# Patient Record
Sex: Female | Born: 1990 | Race: Black or African American | Hispanic: No | Marital: Married | Smoking: Never smoker
Health system: Southern US, Community
[De-identification: ages and names within clinical notes are randomized; demographics above are authoritative.]

## PROBLEM LIST (undated history)

## (undated) ENCOUNTER — Inpatient Hospital Stay (HOSPITAL_COMMUNITY): Payer: Self-pay

## (undated) DIAGNOSIS — A549 Gonococcal infection, unspecified: Secondary | ICD-10-CM

## (undated) DIAGNOSIS — N83209 Unspecified ovarian cyst, unspecified side: Secondary | ICD-10-CM

## (undated) DIAGNOSIS — D649 Anemia, unspecified: Secondary | ICD-10-CM

## (undated) DIAGNOSIS — A749 Chlamydial infection, unspecified: Secondary | ICD-10-CM

## (undated) DIAGNOSIS — B999 Unspecified infectious disease: Secondary | ICD-10-CM

## (undated) HISTORY — PX: WISDOM TOOTH EXTRACTION: SHX21

## (undated) HISTORY — PX: ANTERIOR CRUCIATE LIGAMENT REPAIR: SHX115

---
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
## 2012-03-23 ENCOUNTER — Encounter (HOSPITAL_COMMUNITY): Payer: Self-pay | Admitting: *Deleted

## 2012-03-23 ENCOUNTER — Emergency Department (HOSPITAL_COMMUNITY)
Admission: EM | Admit: 2012-03-23 | Discharge: 2012-03-23 | Disposition: A | Discharge status: Home / Self Care | Payer: Medicaid Other | Attending: Emergency Medicine | Admitting: Emergency Medicine

## 2012-03-23 DIAGNOSIS — E669 Obesity, unspecified: Secondary | ICD-10-CM | POA: Insufficient documentation

## 2012-03-23 DIAGNOSIS — J069 Acute upper respiratory infection, unspecified: Secondary | ICD-10-CM | POA: Insufficient documentation

## 2012-03-23 DIAGNOSIS — N390 Urinary tract infection, site not specified: Secondary | ICD-10-CM | POA: Insufficient documentation

## 2012-03-23 LAB — COMPREHENSIVE METABOLIC PANEL
ALT: 10 U/L (ref 0–35)
Alkaline Phosphatase: 78 U/L (ref 39–117)
CO2: 25 mEq/L (ref 19–32)
Chloride: 100 mEq/L (ref 96–112)
GFR calc Af Amer: >90 mL/min (ref >90)
GFR calc non Af Amer: >90 mL/min (ref >90)
Glucose, Bld: 95 mg/dL (ref 70–99)
Potassium: 4 mEq/L (ref 3.5–5.1)
Sodium: 135 mEq/L (ref 135–145)

## 2012-03-23 LAB — URINALYSIS, ROUTINE W REFLEX MICROSCOPIC
Bilirubin Urine: NEGATIVE
Hgb urine dipstick: NEGATIVE
Ketones, ur: NEGATIVE mg/dL
Nitrite: NEGATIVE
Urobilinogen, UA: 1 mg/dL (ref 0.0–1.0)

## 2012-03-23 LAB — CBC WITH DIFFERENTIAL/PLATELET
Lymphocytes Relative: 26 % (ref 12–46)
Lymphs Abs: 2.1 10*3/uL (ref 0.7–4.0)
MCV: 88.6 fL (ref 78.0–100.0)
Neutro Abs: 5.2 10*3/uL (ref 1.7–7.7)
Neutrophils Relative %: 67 % (ref 43–77)
Platelets: 267 10*3/uL (ref 150–400)
RBC: 3.85 MIL/uL — ABNORMAL LOW (ref 3.87–5.11)
WBC: 7.8 10*3/uL (ref 4.0–10.5)

## 2012-03-23 LAB — URINE MICROSCOPIC-ADD ON

## 2012-03-23 MED ORDER — ACETAMINOPHEN 325 MG PO TABS
650.0000 mg | ORAL_TABLET | Freq: Once | ORAL | Status: AC
  Administered 2012-03-23: 650 mg via ORAL
  Filled 2012-03-23: qty 2

## 2012-03-23 MED ORDER — CEPHALEXIN 500 MG PO CAPS: 500.0000 mg | ORAL_CAPSULE | Freq: Four times a day (QID) | ORAL | Status: DC

## 2012-03-23 NOTE — ED Notes (Signed)
Pt arrived by EMS. C/o abd/ back pain with n/v fever and body aches x's 2 weeks.

## 2012-03-23 NOTE — ED Provider Notes (Signed)
History     CSN: [id number]  Arrival date & time 03/23/12  1906   First MD Initiated Contact with Patient 03/23/12 2220      Chief Complaint  Patient presents with  . Fever  . Abdominal Pain   HPI  History provided by the patient. Patient is 21 year old female with no significant PMH who presents with complaints of fever, body aches, nasal congestion, rhinorrhea and slight cough for the past one to 2 weeks. Symptoms began with nasal congestion and sinus pressure. Patient reports prior history of seasonal sinus problems. Symptoms progressed into fever, body aches and occasional cough symptoms. Over the past few days patient also reports some lower abdominal discomfort radiating to bilateral sides and back. She denies having any dysuria, hematuria, urinary frequency. She denies any vaginal bleeding or vaginal discharge or irregular menstrual cycle. She denies any nausea, vomiting, diarrhea or constipation symptoms. Patient has no recent travel. She has been around some friends with recent illness of cough and congestion symptoms.    History reviewed. No pertinent past medical history.  Past Surgical History  Procedure Date  . Wisdom tooth extraction   . Anterior cruciate ligament repair Left knee    History reviewed. No pertinent family history.  History  Substance Use Topics  . Smoking status: Not on file  . Smokeless tobacco: Not on file  . Alcohol Use: Yes    OB History    Grav Para Term Preterm Abortions TAB SAB Ect Mult Living                  Review of Systems  Constitutional: Positive for fever and fatigue. Negative for chills, diaphoresis and appetite change.  HENT: Positive for congestion, rhinorrhea and sinus pressure. Negative for sore throat.   Respiratory: Positive for cough.   Gastrointestinal: Positive for abdominal pain. Negative for nausea, vomiting, diarrhea and constipation.  Genitourinary: Negative for dysuria, frequency, hematuria, flank pain, vaginal  bleeding, vaginal discharge, menstrual problem and pelvic pain.  Musculoskeletal: Positive for back pain.    Allergies  Review of patient's allergies indicates no known allergies.  Home Medications  No current outpatient prescriptions on file.  BP 137/78  Pulse 97  Temp 99.1 F (37.3 C) (Oral)  Resp 20  Ht 5\' 4"  (1.626 m)  Wt 180 lb (81.647 kg)  BMI 30.90 kg/m2  SpO2 100%  LMP 03/13/2012  Physical Exam  Nursing note and vitals reviewed. Constitutional: She is oriented to person, place, and time. She appears well-developed and well-nourished. No distress.  HENT:  Head: Normocephalic.  Right Ear: Tympanic membrane normal.  Nose: Nose normal.  Mouth/Throat: Oropharynx is clear and moist.  Eyes: Conjunctivae normal and EOM are normal. Pupils are equal, round, and reactive to light.  Neck: Normal range of motion. Neck supple.       No meningeal signs  Cardiovascular: Normal rate and regular rhythm.   Pulmonary/Chest: Effort normal and breath sounds normal. No respiratory distress. She has no rales.  Abdominal: Soft. She exhibits no distension and no mass. There is tenderness. There is no rebound, no guarding, no CVA tenderness, no tenderness at McBurney's point and negative Murphy's sign.       Obese. Very mild diffuse lower tenderness.  Neurological: She is alert and oriented to person, place, and time.  Skin: Skin is warm and dry. No rash noted. No erythema.  Psychiatric: She has a normal mood and affect. Her behavior is normal.    ED Course  Procedures  Results for orders placed during the hospital encounter of 03/23/12  CBC WITH DIFFERENTIAL      Component Value Range   WBC 7.8  4.0 - 10.5 K/uL   RBC 3.85 (*) 3.87 - 5.11 MIL/uL   Hemoglobin 11.4 (*) 12.0 - 15.0 g/dL   HCT 16.1 (*) 09.6 - 04.5 %   MCV 88.6  78.0 - 100.0 fL   MCH 29.6  26.0 - 34.0 pg   MCHC 33.4  30.0 - 36.0 g/dL   RDW 40.9  81.1 - 91.4 %   Platelets 267  150 - 400 K/uL   Neutrophils Relative 67   43 - 77 %   Neutro Abs 5.2  1.7 - 7.7 K/uL   Lymphocytes Relative 26  12 - 46 %   Lymphs Abs 2.1  0.7 - 4.0 K/uL   Monocytes Relative 7  3 - 12 %   Monocytes Absolute 0.5  0.1 - 1.0 K/uL   Eosinophils Relative 0  0 - 5 %   Eosinophils Absolute 0.0  0.0 - 0.7 K/uL   Basophils Relative 0  0 - 1 %   Basophils Absolute 0.0  0.0 - 0.1 K/uL  COMPREHENSIVE METABOLIC PANEL      Component Value Range   Sodium 135  135 - 145 mEq/L   Potassium 4.0  3.5 - 5.1 mEq/L   Chloride 100  96 - 112 mEq/L   CO2 25  19 - 32 mEq/L   Glucose, Bld 95  70 - 99 mg/dL   BUN 8  6 - 23 mg/dL   Creatinine, Ser 7.82  0.50 - 1.10 mg/dL   Calcium 9.6  8.4 - 95.6 mg/dL   Total Protein 7.5  6.0 - 8.3 g/dL   Albumin 3.3 (*) 3.5 - 5.2 g/dL   AST 16  0 - 37 U/L   ALT 10  0 - 35 U/L   Alkaline Phosphatase 78  39 - 117 U/L   Total Bilirubin 0.3  0.3 - 1.2 mg/dL   GFR calc non Af Amer >90  >90 mL/min   GFR calc Af Amer >90  >90 mL/min  URINALYSIS, ROUTINE W REFLEX MICROSCOPIC      Component Value Range   Color, Urine YELLOW  YELLOW   APPearance CLOUDY (*) CLEAR   Specific Gravity, Urine 1.017  1.005 - 1.030   pH 7.5  5.0 - 8.0   Glucose, UA NEGATIVE  NEGATIVE mg/dL   Hgb urine dipstick NEGATIVE  NEGATIVE   Bilirubin Urine NEGATIVE  NEGATIVE   Ketones, ur NEGATIVE  NEGATIVE mg/dL   Protein, ur 30 (*) NEGATIVE mg/dL   Urobilinogen, UA 1.0  0.0 - 1.0 mg/dL   Nitrite NEGATIVE  NEGATIVE   Leukocytes, UA LARGE (*) NEGATIVE  PREGNANCY, URINE      Component Value Range   Preg Test, Ur NEGATIVE  NEGATIVE  URINE MICROSCOPIC-ADD ON      Component Value Range   Squamous Epithelial / LPF MANY (*) RARE   WBC, UA 11-20  <3 WBC/hpf   Bacteria, UA MANY (*) RARE       1. UTI (lower urinary tract infection)   2. URI (upper respiratory infection)       MDM  10:30PM patient seen and evaluated. Patient in no acute distress. Patient is well-appearing and nontoxic.   UA shows signs concerning for possible UTI. Patient  has unremarkable exam otherwise. She appears well. Patient's primary symptoms consistent with viral URI. Will plan to  treat UTI with Keflex.        Angus Seller, [place] 03/24/12 [phone]

## 2012-03-23 NOTE — ED Notes (Signed)
Pt reports fever, body aches, nonproductive cough x 1 week. States that she gets a sinus infection every fall, but that she thinks she has the flu now. Pt has only taken OTC sinus medicine. Recheck of temperature is 99.1

## 2012-03-24 NOTE — ED Provider Notes (Signed)
Medical screening examination/treatment/procedure(s) were performed by non-physician practitioner and as supervising physician I was immediately available for consultation/collaboration.  Elinor Kleine, MD 03/24/12 1535 

## 2012-05-02 ENCOUNTER — Emergency Department (HOSPITAL_COMMUNITY)
Admission: EM | Admit: 2012-05-02 | Discharge: 2012-05-02 | Disposition: A | Discharge status: Home / Self Care | Payer: PRIVATE HEALTH INSURANCE | Attending: Emergency Medicine | Admitting: Emergency Medicine

## 2012-05-02 ENCOUNTER — Encounter (HOSPITAL_COMMUNITY): Payer: Self-pay | Admitting: Emergency Medicine

## 2012-05-02 DIAGNOSIS — B9689 Other specified bacterial agents as the cause of diseases classified elsewhere: Secondary | ICD-10-CM | POA: Insufficient documentation

## 2012-05-02 DIAGNOSIS — N760 Acute vaginitis: Secondary | ICD-10-CM | POA: Insufficient documentation

## 2012-05-02 LAB — URINALYSIS, ROUTINE W REFLEX MICROSCOPIC
Glucose, UA: NEGATIVE mg/dL
Hgb urine dipstick: NEGATIVE
Specific Gravity, Urine: 1.027 (ref 1.005–1.030)
Urobilinogen, UA: 0.2 mg/dL (ref 0.0–1.0)

## 2012-05-02 LAB — URINE MICROSCOPIC-ADD ON

## 2012-05-02 LAB — PREGNANCY, URINE: Preg Test, Ur: NEGATIVE

## 2012-05-02 LAB — WET PREP, GENITAL

## 2012-05-02 MED ORDER — METRONIDAZOLE 500 MG PO TABS: 2000.0000 mg | ORAL_TABLET | Freq: Once | ORAL | Status: DC

## 2012-05-02 NOTE — ED Provider Notes (Signed)
History     CSN: [id number]  Arrival date & time 05/02/12  1650   First MD Initiated Contact with Patient 05/02/12 1756      Chief Complaint  Patient presents with  . Abdominal Pain     HPI Patient presents emergent complaints of abdominal pain ongoing since October 10. She was seen in the emergency room at that time and was diagnosed with a urinary tract infection. She took antibiotics but does not feel like the symptoms have improved that much she does not have any dysuria or hematuria. She has not had any fevers vomiting or diarrhea. She states she has a discomfort in her suprapubic region somewhat intermittent..  she denies any vaginal discharge or any dyspareunia.  Patient does not have a doctor in this area so she came to the emergency department for treatment History reviewed. No pertinent past medical history.  Past Surgical History  Procedure Date  . Wisdom tooth extraction   . Anterior cruciate ligament repair Left knee    History reviewed. No pertinent family history.  History  Substance Use Topics  . Smoking status: Never Smoker   . Smokeless tobacco: Not on file  . Alcohol Use: Yes    OB History    Grav Para Term Preterm Abortions TAB SAB Ect Mult Living                  Review of Systems  All other systems reviewed and are negative.    Allergies  Review of patient's allergies indicates no known allergies.  Home Medications   Current Outpatient Rx  Name  Route  Sig  Dispense  Refill  . IBUPROFEN 200 MG PO TABS   Oral   Take 600 mg by mouth every 6 (six) hours as needed. Pain         . METRONIDAZOLE 500 MG PO TABS   Oral   Take 4 tablets (2,000 mg total) by mouth once.   4 tablet   0     BP 126/72  Pulse 87  Temp 98.9 F (37.2 C) (Oral)  Resp 15  SpO2 100%  LMP 04/25/2012  Physical Exam  Nursing note and vitals reviewed. Constitutional: She appears well-developed and well-nourished. No distress.  HENT:  Head: Normocephalic and  atraumatic.  Right Ear: External ear normal.  Left Ear: External ear normal.  Eyes: Conjunctivae normal are normal. Right eye exhibits no discharge. Left eye exhibits no discharge. No scleral icterus.  Neck: Neck supple. No tracheal deviation present.  Cardiovascular: Normal rate, regular rhythm and intact distal pulses.   Pulmonary/Chest: Effort normal and breath sounds normal. No stridor. No respiratory distress. She has no wheezes. She has no rales.  Abdominal: Soft. Bowel sounds are normal. She exhibits no distension. There is no tenderness. There is no rebound and no guarding.  Genitourinary: There is no rash on the right labia. There is no rash on the left labia. Uterus is not deviated, not enlarged and not tender. Cervix exhibits discharge. Cervix exhibits no motion tenderness and no friability. Right adnexum displays no mass and no tenderness. Left adnexum displays no mass and no tenderness. No erythema, tenderness or bleeding around the vagina. No foreign body around the vagina. No signs of injury around the vagina. Vaginal discharge found.  Musculoskeletal: She exhibits no edema and no tenderness.  Neurological: She is alert. She has normal strength. No sensory deficit. Cranial nerve deficit:  no gross defecits noted. She exhibits normal muscle tone. She displays  no seizure activity. Coordination normal.  Skin: Skin is warm and dry. No rash noted.  Psychiatric: She has a normal mood and affect.    ED Course  Procedures (including critical care time)  Labs Reviewed  URINALYSIS, ROUTINE W REFLEX MICROSCOPIC - Abnormal; Notable for the following:    APPearance CLOUDY (*)     Leukocytes, UA MODERATE (*)     All other components within normal limits  URINE MICROSCOPIC-ADD ON - Abnormal; Notable for the following:    Squamous Epithelial / LPF FEW (*)     Bacteria, UA FEW (*)     All other components within normal limits  WET PREP, GENITAL - Abnormal; Notable for the following:    Clue  Cells Wet Prep HPF POC MANY (*)     WBC, Wet Prep HPF POC MANY (*)     All other components within normal limits  PREGNANCY, URINE  URINE CULTURE  GC/CHLAMYDIA PROBE AMP  RPR   No results found.   1. Bacterial vaginosis       MDM  Will treat with flagyl.  Doubt uti.  Doubt PID       Celene Kras, MD 05/03/12 [phone]

## 2012-05-02 NOTE — ED Notes (Addendum)
Patient diagnosed with UTI on Oct 10th.  Patient took her medication- 7 day treatment with Keflex but pain returned one week ago.  Pt denies vaginal discharge or dysuria.

## 2012-05-02 NOTE — ED Notes (Signed)
Pt complains of pain in lower abdominal area and dysuria

## 2012-05-02 NOTE — ED Notes (Signed)
Patient reports that she was here on the 10th of October for similar symptoms and the medications that she was given did not work. She reports that she is still having lower abdominal pain.

## 2012-05-02 NOTE — Progress Notes (Signed)
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  Pt voiced concern about moving from triage to acute room #1 Pt states she has returned to [facility] ED because of need to change uti medication.  Pt confirms urine sample provided Cm explained to pt that EDP will review UA, visit her with results and decide if another test is needed Pt voiced understanding Discussed and proved pt advocate contact information if needed

## 2012-05-03 LAB — URINE CULTURE

## 2012-05-03 LAB — RPR: RPR Ser Ql: NONREACTIVE

## 2012-05-03 LAB — GC/CHLAMYDIA PROBE AMP, GENITAL: Chlamydia, DNA Probe: POSITIVE — AB

## 2012-05-11 NOTE — ED Notes (Signed)
Chart returned from EDP office with orders written by Rhea Bleacher for Azithromycin  1000 mg po once and cefixime 400 mg po once.

## 2012-05-13 ENCOUNTER — Telehealth (HOSPITAL_COMMUNITY): Payer: Self-pay | Admitting: Emergency Medicine

## 2012-05-13 NOTE — ED Notes (Signed)
Rxs called in to [facility] in [place], [place] [phone]) by Jaci Lazier PFM. Per pharmacy, have to order cefixime--will have for the patient on Mon.

## 2014-01-16 ENCOUNTER — Encounter (HOSPITAL_COMMUNITY): Payer: Self-pay | Admitting: *Deleted

## 2014-01-16 ENCOUNTER — Inpatient Hospital Stay (HOSPITAL_COMMUNITY): Payer: Medicaid Other

## 2014-01-16 ENCOUNTER — Inpatient Hospital Stay (HOSPITAL_COMMUNITY)
Admission: AD | Admit: 2014-01-16 | Discharge: 2014-01-16 | Disposition: A | Discharge status: Home / Self Care | Payer: Medicaid Other | Source: Ambulatory Visit | Attending: Obstetrics & Gynecology | Admitting: Obstetrics & Gynecology

## 2014-01-16 DIAGNOSIS — R109 Unspecified abdominal pain: Secondary | ICD-10-CM | POA: Diagnosis present

## 2014-01-16 DIAGNOSIS — N760 Acute vaginitis: Secondary | ICD-10-CM | POA: Diagnosis not present

## 2014-01-16 DIAGNOSIS — O2390 Unspecified genitourinary tract infection in pregnancy, unspecified trimester: Secondary | ICD-10-CM | POA: Diagnosis not present

## 2014-01-16 DIAGNOSIS — A499 Bacterial infection, unspecified: Secondary | ICD-10-CM | POA: Diagnosis not present

## 2014-01-16 DIAGNOSIS — B9689 Other specified bacterial agents as the cause of diseases classified elsewhere: Secondary | ICD-10-CM | POA: Insufficient documentation

## 2014-01-16 DIAGNOSIS — O9989 Other specified diseases and conditions complicating pregnancy, childbirth and the puerperium: Secondary | ICD-10-CM

## 2014-01-16 DIAGNOSIS — O26899 Other specified pregnancy related conditions, unspecified trimester: Secondary | ICD-10-CM

## 2014-01-16 LAB — CBC
HEMATOCRIT: 39.8 % (ref 36.0–46.0)
Hemoglobin: 13.4 g/dL (ref 12.0–15.0)
MCH: 30.5 pg (ref 26.0–34.0)
MCHC: 33.7 g/dL (ref 30.0–36.0)
MCV: 90.7 fL (ref 78.0–100.0)
Platelets: 241 10*3/uL (ref 150–400)
RBC: 4.39 MIL/uL (ref 3.87–5.11)
RDW: 13.4 % (ref 11.5–15.5)
WBC: 9.8 10*3/uL (ref 4.0–10.5)

## 2014-01-16 LAB — WET PREP, GENITAL
Trich, Wet Prep: NONE SEEN
YEAST WET PREP: NONE SEEN

## 2014-01-16 LAB — URINALYSIS, ROUTINE W REFLEX MICROSCOPIC
BILIRUBIN URINE: NEGATIVE
Glucose, UA: NEGATIVE mg/dL
HGB URINE DIPSTICK: NEGATIVE
KETONES UR: NEGATIVE mg/dL
Leukocytes, UA: NEGATIVE
Nitrite: NEGATIVE
PROTEIN: NEGATIVE mg/dL
Specific Gravity, Urine: 1.015 (ref 1.005–1.030)
UROBILINOGEN UA: 0.2 mg/dL (ref 0.0–1.0)
pH: 6 (ref 5.0–8.0)

## 2014-01-16 LAB — ABO/RH: ABO/RH(D): A POS

## 2014-01-16 LAB — OB RESULTS CONSOLE GC/CHLAMYDIA
CHLAMYDIA, DNA PROBE: NEGATIVE
Gonorrhea: NEGATIVE

## 2014-01-16 LAB — HCG, QUANTITATIVE, PREGNANCY: hCG, Beta Chain, Quant, S: 204 m[IU]/mL — ABNORMAL HIGH (ref <5)

## 2014-01-16 LAB — POCT PREGNANCY, URINE: Preg Test, Ur: POSITIVE — AB

## 2014-01-16 MED ORDER — METRONIDAZOLE 500 MG PO TABS: 500.0000 mg | ORAL_TABLET | Freq: Two times a day (BID) | ORAL | Status: DC

## 2014-01-16 NOTE — MAU Note (Signed)
Pos UPT @ [facility], approx 5 weeks, extreme cramping for the last week.  Denies bleeding or discharge.

## 2014-01-16 NOTE — Discharge Instructions (Signed)

## 2014-01-16 NOTE — MAU Provider Note (Signed)
History     CSN: [id number]  Arrival date and time: 01/16/14 1339   None     Chief Complaint  Patient presents with  . Abdominal Pain   Abdominal Pain Associated symptoms include frequency and headaches. Pertinent negatives include no dysuria, nausea or vomiting.    Pt is a G1P0 at 6377w5d wks pregnancy by certain LMP.  [facility] approximately 5 weeks ago, extreme cramping last week.  Denies vaginal bleeding or pain.  History reviewed. No pertinent past medical history.  Past Surgical History  Procedure Laterality Date  . Wisdom tooth extraction    . Anterior cruciate ligament repair  Left knee    History reviewed. No pertinent family history.  History  Substance Use Topics  . Smoking status: Never Smoker   . Smokeless tobacco: Never Used  . Alcohol Use: No    Allergies: No Known Allergies  Prescriptions prior to admission  Medication Sig Dispense Refill  . ibuprofen (ADVIL,MOTRIN) 200 MG tablet Take 600 mg by mouth every 6 (six) hours as needed. Pain      . metroNIDAZOLE (FLAGYL) 500 MG tablet Take 4 tablets (2,000 mg total) by mouth once.  4 tablet  0    Review of Systems  Constitutional: Positive for malaise/fatigue.  Gastrointestinal: Positive for abdominal pain (lower midpelvic). Negative for nausea and vomiting.  Genitourinary: Positive for frequency. Negative for dysuria and urgency.  Musculoskeletal: Positive for back pain (lower mid back).  Neurological: Positive for headaches.  All other systems reviewed and are negative.  Physical Exam   Blood pressure 135/74, pulse 90, temperature 98.8 F (37.1 C), temperature source Oral, resp. rate 18, height 5\' 4"  (1.626 m), weight 90.538 kg (199 lb 9.6 oz), last menstrual period 12/14/2013.  Physical Exam  Constitutional: She is oriented to person, place, and time. She appears well-developed and well-nourished. No distress.  HENT:  Head: Normocephalic.  Neck: Normal  range of motion. Neck supple.  Cardiovascular: Normal rate, regular rhythm and normal heart sounds.   Respiratory: Effort normal and breath sounds normal. No respiratory distress.  GI: Soft. There is no tenderness. There is no CVA tenderness.  Genitourinary: Uterus is enlarged. Cervix exhibits no motion tenderness and no discharge. Vaginal discharge (white, creamy) found.  Musculoskeletal: Normal range of motion.  Neurological: She is alert and oriented to person, place, and time.  Skin: Skin is warm and dry.  Psychiatric: She has a normal mood and affect.    MAU Course  Procedures Results for orders placed during the hospital encounter of 01/16/14 (from the past 24 hour(s))  URINALYSIS, ROUTINE W REFLEX MICROSCOPIC     Status: None   Collection Time    01/16/14  1:55 PM      Result Value Ref Range   Color, Urine YELLOW  YELLOW   APPearance CLEAR  CLEAR   Specific Gravity, Urine 1.015  1.005 - 1.030   pH 6.0  5.0 - 8.0   Glucose, UA NEGATIVE  NEGATIVE mg/dL   Hgb urine dipstick NEGATIVE  NEGATIVE   Bilirubin Urine NEGATIVE  NEGATIVE   Ketones, ur NEGATIVE  NEGATIVE mg/dL   Protein, ur NEGATIVE  NEGATIVE mg/dL   Urobilinogen, UA 0.2  0.0 - 1.0 mg/dL   Nitrite NEGATIVE  NEGATIVE   Leukocytes, UA NEGATIVE  NEGATIVE  POCT PREGNANCY, URINE     Status: Abnormal   Collection Time    01/16/14  2:55 PM      Result Value Ref  Range   Preg Test, Ur POSITIVE (*) NEGATIVE  CBC     Status: None   Collection Time    01/16/14  3:18 PM      Result Value Ref Range   WBC 9.8  4.0 - 10.5 K/uL   RBC 4.39  3.87 - 5.11 MIL/uL   Hemoglobin 13.4  12.0 - 15.0 g/dL   HCT 16.1  09.6 - 04.5 %   MCV 90.7  78.0 - 100.0 fL   MCH 30.5  26.0 - 34.0 pg   MCHC 33.7  30.0 - 36.0 g/dL   RDW 40.9  81.1 - 91.4 %   Platelets 241  150 - 400 K/uL  ABO/RH     Status: None   Collection Time    01/16/14  3:18 PM      Result Value Ref Range   ABO/RH(D) A POS    HCG, QUANTITATIVE, PREGNANCY     Status: Abnormal    Collection Time    01/16/14  3:18 PM      Result Value Ref Range   hCG, Beta Chain, Quant, S 204 (*) <5 mIU/mL  WET PREP, GENITAL     Status: Abnormal   Collection Time    01/16/14  3:47 PM      Result Value Ref Range   Yeast Wet Prep HPF POC NONE SEEN  NONE SEEN   Trich, Wet Prep NONE SEEN  NONE SEEN   Clue Cells Wet Prep HPF POC FEW (*) NONE SEEN   WBC, Wet Prep HPF POC FEW (*) NONE SEEN   Ultrasound: FINDINGS:  Intrauterine gestational sac: Not visualized  Yolk sac: Not visualized  Embryo: Not visualized  Maternal uterus/adnexae: The uterus is within normal limits. No  hemorrhage is present. The ovaries are normal bilaterally. A small  moderate amount of free fluid is evident.  IMPRESSION:  1. No intrauterine pregnancy is identified. Given the very low  quantitative beta HCG, this could represent a very early pregnancy  or a recent spontaneous abortion. Recommend follow up evaluation  with HCG and ultrasound as indicated.  2. Small to moderate free fluid may be physiologic.  Assessment and Plan  23 yo G1P0 at [redacted]w[redacted]d wks Pregnancy Abdominal Pain in Pregnancy Bacterial Vaginosis  Plan: Discharge to home RX Flagyl Repeat BHCG in 48 hours Ectopic precautions reviewed  Marlis Edelson 01/16/2014, 3:28 PM

## 2014-01-17 LAB — GC/CHLAMYDIA PROBE AMP
CT PROBE, AMP APTIMA: NEGATIVE
GC Probe RNA: NEGATIVE

## 2014-01-18 ENCOUNTER — Inpatient Hospital Stay (HOSPITAL_COMMUNITY)
Admission: AD | Admit: 2014-01-18 | Discharge: 2014-01-18 | Disposition: A | Discharge status: Home / Self Care | Payer: Medicaid Other | Source: Ambulatory Visit | Attending: Obstetrics & Gynecology | Admitting: Obstetrics & Gynecology

## 2014-01-18 DIAGNOSIS — O99891 Other specified diseases and conditions complicating pregnancy: Secondary | ICD-10-CM | POA: Insufficient documentation

## 2014-01-18 DIAGNOSIS — O9989 Other specified diseases and conditions complicating pregnancy, childbirth and the puerperium: Secondary | ICD-10-CM

## 2014-01-18 DIAGNOSIS — R109 Unspecified abdominal pain: Secondary | ICD-10-CM | POA: Diagnosis not present

## 2014-01-18 DIAGNOSIS — O26899 Other specified pregnancy related conditions, unspecified trimester: Secondary | ICD-10-CM

## 2014-01-18 LAB — HCG, QUANTITATIVE, PREGNANCY: HCG, BETA CHAIN, QUANT, S: 442 m[IU]/mL — AB (ref <5)

## 2014-01-18 NOTE — MAU Provider Note (Signed)
  History     CSN: [id number]  Arrival date and time: 01/18/14 0721   None     Chief Complaint  Patient presents with  . Follow-up   HPI This is a 23 y.o. at 970w0d who presents for followup of HCG levels. She was seen for abdominal pain two days ago. She has less pain now, mostly described as "gas pains".  Denies bleeding.   OB History   Grav Para Term Preterm Abortions TAB SAB Ect Mult Living   1               No past medical history on file.  Past Surgical History  Procedure Laterality Date  . Wisdom tooth extraction    . Anterior cruciate ligament repair  Left knee    No family history on file.  History  Substance Use Topics  . Smoking status: Never Smoker   . Smokeless tobacco: Never Used  . Alcohol Use: No    Allergies: Not on File  Prescriptions prior to admission  Medication Sig Dispense Refill  . metroNIDAZOLE (FLAGYL) 500 MG tablet Take 1 tablet (500 mg total) by mouth 2 (two) times daily.  14 tablet  0  . Prenatal Vit-Fe Fumarate-FA (PRENATAL MULTIVITAMIN) TABS tablet Take 1 tablet by mouth daily at 12 noon.        Review of Systems  Constitutional: Negative for fever, chills and malaise/fatigue.  Gastrointestinal: Positive for abdominal pain. Negative for nausea, vomiting, diarrhea and constipation.  Genitourinary: Negative for dysuria.   Physical Exam   Blood pressure 130/63, pulse 87, temperature 98.6 F (37 C), resp. rate 16, last menstrual period 12/14/2013, SpO2 100.00%.  Physical Exam  Constitutional: She is oriented to person, place, and time. She appears well-developed and well-nourished. No distress.  HENT:  Head: Normocephalic.  Cardiovascular: Normal rate.   Respiratory: Effort normal.  Musculoskeletal: Normal range of motion.  Neurological: She is alert and oriented to person, place, and time.  Skin: Skin is warm.  Psychiatric: She has a normal mood and affect.    MAU Course  Procedures  MDM Results for orders placed during  the hospital encounter of 01/18/14 (from the past 24 hour(s))  HCG, QUANTITATIVE, PREGNANCY     Status: Abnormal   Collection Time    01/18/14  7:45 AM      Result Value Ref Range   hCG, Beta Chain, Quant, S 442 (*) <5 mIU/mL   Results for Vladimir FasterRICHMOND, Lalah (MRN [id number]) as of 01/18/2014 08:49  Ref. Range 01/16/2014 15:18  hCG, Beta Chain, Quant, S Latest Range: <5 mIU/mL 204 (H)    Assessment and Plan  A:  Abdominal pain in pregnancy.      Appropriately rising quant HCG levels  P  Discussed findings.      Return for US in one week (should be 3000-4000 by then      Ectopic precautions  [facility] Hospital[facility]ILLIAMS,Rykar Lebleu 01/18/2014, 8:50 AM

## 2014-01-18 NOTE — MAU Note (Signed)
Patient to MAU for repeat BHCG. States she has gas like pain and occasional cramping. Denies bleeding.

## 2014-01-18 NOTE — Discharge Instructions (Signed)

## 2014-01-26 ENCOUNTER — Ambulatory Visit (HOSPITAL_COMMUNITY)
Admission: RE | Admit: 2014-01-26 | Discharge: 2014-01-26 | Disposition: A | Discharge status: Home / Self Care | Payer: Medicaid Other | Source: Ambulatory Visit | Attending: Advanced Practice Midwife | Admitting: Advanced Practice Midwife

## 2014-01-26 ENCOUNTER — Encounter (HOSPITAL_COMMUNITY): Payer: Self-pay | Admitting: *Deleted

## 2014-01-26 ENCOUNTER — Inpatient Hospital Stay (HOSPITAL_COMMUNITY)
Admission: AD | Admit: 2014-01-26 | Discharge: 2014-01-26 | Disposition: A | Discharge status: Home / Self Care | Payer: Medicaid Other | Source: Ambulatory Visit | Attending: Obstetrics and Gynecology | Admitting: Obstetrics and Gynecology

## 2014-01-26 DIAGNOSIS — O26899 Other specified pregnancy related conditions, unspecified trimester: Secondary | ICD-10-CM

## 2014-01-26 DIAGNOSIS — R109 Unspecified abdominal pain: Secondary | ICD-10-CM

## 2014-01-26 DIAGNOSIS — O99891 Other specified diseases and conditions complicating pregnancy: Secondary | ICD-10-CM | POA: Diagnosis not present

## 2014-01-26 DIAGNOSIS — O9989 Other specified diseases and conditions complicating pregnancy, childbirth and the puerperium: Principal | ICD-10-CM

## 2014-01-26 DIAGNOSIS — Z3200 Encounter for pregnancy test, result unknown: Secondary | ICD-10-CM

## 2014-01-26 DIAGNOSIS — N831 Corpus luteum cyst of ovary, unspecified side: Secondary | ICD-10-CM | POA: Insufficient documentation

## 2014-01-26 DIAGNOSIS — O34599 Maternal care for other abnormalities of gravid uterus, unspecified trimester: Secondary | ICD-10-CM | POA: Insufficient documentation

## 2014-01-26 NOTE — MAU Note (Signed)
Here for viability US. No complaints.

## 2014-01-26 NOTE — MAU Provider Note (Signed)
Attestation of Attending Supervision of Advanced Practitioner (CNM/NP): Evaluation and management procedures were performed by the Advanced Practitioner under my supervision and collaboration.  I have reviewed the Advanced Practitioner's note and chart, and I agree with the management and plan.  Yula Crotwell 01/26/2014 1:20 PM   

## 2014-01-26 NOTE — Discharge Instructions (Signed)
First Trimester of Pregnancy The first trimester of pregnancy is from week 1 until the end of week 12 (months 1 through 3). A week after a sperm fertilizes an egg, the egg will implant on the wall of the uterus. This embryo will begin to develop into a baby. Genes from you and your partner are forming the baby. The female genes determine whether the baby is a boy or a girl. At 6-8 weeks, the eyes and face are formed, and the heartbeat can be seen on ultrasound. At the end of 12 weeks, all the baby's organs are formed.  Now that you are pregnant, you will want to do everything you can to have a healthy baby. Two of the most important things are to get good prenatal care and to follow your health care provider's instructions. Prenatal care is all the medical care you receive before the baby's birth. This care will help prevent, find, and treat any problems during the pregnancy and childbirth. BODY CHANGES Your body goes through many changes during pregnancy. The changes vary from woman to woman.   You may gain or lose a couple of pounds at first.  You may feel sick to your stomach (nauseous) and throw up (vomit). If the vomiting is uncontrollable, call your health care provider.  You may tire easily.  You may develop headaches that can be relieved by medicines approved by your health care provider.  You may urinate more often. Painful urination may mean you have a bladder infection.  You may develop heartburn as a result of your pregnancy.  You may develop constipation because certain hormones are causing the muscles that push waste through your intestines to slow down.  You may develop hemorrhoids or swollen, bulging veins (varicose veins).  Your breasts may begin to grow larger and become tender. Your nipples may stick out more, and the tissue that surrounds them (areola) may become darker.  Your gums may bleed and may be sensitive to brushing and flossing.  Dark spots or blotches (chloasma,  mask of pregnancy) may develop on your face. This will likely fade after the baby is born.  Your menstrual periods will stop.  You may have a loss of appetite.  You may develop cravings for certain kinds of food.  You may have changes in your emotions from day to day, such as being excited to be pregnant or being concerned that something may go wrong with the pregnancy and baby.  You may have more vivid and strange dreams.  You may have changes in your hair. These can include thickening of your hair, rapid growth, and changes in texture. Some women also have hair loss during or after pregnancy, or hair that feels dry or thin. Your hair will most likely return to normal after your baby is born. WHAT TO EXPECT AT YOUR PRENATAL VISITS During a routine prenatal visit:  You will be weighed to make sure you and the baby are growing normally.  Your blood pressure will be taken.  Your abdomen will be measured to track your baby's growth.  The fetal heartbeat will be listened to starting around week 10 or 12 of your pregnancy.  Test results from any previous visits will be discussed. Your health care provider may ask you:  How you are feeling.  If you are feeling the baby move.  If you have had any abnormal symptoms, such as leaking fluid, bleeding, severe headaches, or abdominal cramping.  If you have any questions. Other tests   that may be performed during your first trimester include:  Blood tests to find your blood type and to check for the presence of any previous infections. They will also be used to check for low iron levels (anemia) and Rh antibodies. Later in the pregnancy, blood tests for diabetes will be done along with other tests if problems develop.  Urine tests to check for infections, diabetes, or protein in the urine.  An ultrasound to confirm the proper growth and development of the baby.  An amniocentesis to check for possible genetic problems.  Fetal screens for  spina bifida and Down syndrome.  You may need other tests to make sure you and the baby are doing well. HOME CARE INSTRUCTIONS  Medicines  Follow your health care provider's instructions regarding medicine use. Specific medicines may be either safe or unsafe to take during pregnancy.  Take your prenatal vitamins as directed.  If you develop constipation, try taking a stool softener if your health care provider approves. Diet  Eat regular, well-balanced meals. Choose a variety of foods, such as meat or vegetable-based protein, fish, milk and low-fat dairy products, vegetables, fruits, and whole grain breads and cereals. Your health care provider will help you determine the amount of weight gain that is right for you.  Avoid raw meat and uncooked cheese. These carry germs that can cause birth defects in the baby.  Eating four or five small meals rather than three large meals a day may help relieve nausea and vomiting. If you start to feel nauseous, eating a few soda crackers can be helpful. Drinking liquids between meals instead of during meals also seems to help nausea and vomiting.  If you develop constipation, eat more high-fiber foods, such as fresh vegetables or fruit and whole grains. Drink enough fluids to keep your urine clear or pale yellow. Activity and Exercise  Exercise only as directed by your health care provider. Exercising will help you:  Control your weight.  Stay in shape.  Be prepared for labor and delivery.  Experiencing pain or cramping in the lower abdomen or low back is a good sign that you should stop exercising. Check with your health care provider before continuing normal exercises.  Try to avoid standing for long periods of time. Move your legs often if you must stand in one place for a long time.  Avoid heavy lifting.  Wear low-heeled shoes, and practice good posture.  You may continue to have sex unless your health care provider directs you  otherwise. Relief of Pain or Discomfort  Wear a good support bra for breast tenderness.   Take warm sitz baths to soothe any pain or discomfort caused by hemorrhoids. Use hemorrhoid cream if your health care provider approves.   Rest with your legs elevated if you have leg cramps or low back pain.  If you develop varicose veins in your legs, wear support hose. Elevate your feet for 15 minutes, 3-4 times a day. Limit salt in your diet. Prenatal Care  Schedule your prenatal visits by the twelfth week of pregnancy. They are usually scheduled monthly at first, then more often in the last 2 months before delivery.  Write down your questions. Take them to your prenatal visits.  Keep all your prenatal visits as directed by your health care provider. Safety  Wear your seat belt at all times when driving.  Make a list of emergency phone numbers, including numbers for family, friends, the hospital, and police and fire departments. General Tips    Ask your health care provider for a referral to a local prenatal education class. Begin classes no later than at the beginning of month 6 of your pregnancy.  Ask for help if you have counseling or nutritional needs during pregnancy. Your health care provider can offer advice or refer you to specialists for help with various needs.  Do not use hot tubs, steam rooms, or saunas.  Do not douche or use tampons or scented sanitary pads.  Do not cross your legs for long periods of time.  Avoid cat litter boxes and soil used by cats. These carry germs that can cause birth defects in the baby and possibly loss of the fetus by miscarriage or stillbirth.  Avoid all smoking, herbs, alcohol, and medicines not prescribed by your health care provider. Chemicals in these affect the formation and growth of the baby.  Schedule a dentist appointment. At home, brush your teeth with a soft toothbrush and be gentle when you floss. SEEK MEDICAL CARE IF:   You have  dizziness.  You have mild pelvic cramps, pelvic pressure, or nagging pain in the abdominal area.  You have persistent nausea, vomiting, or diarrhea.  You have a bad smelling vaginal discharge.  You have pain with urination.  You notice increased swelling in your face, hands, legs, or ankles. SEEK IMMEDIATE MEDICAL CARE IF:   You have a fever.  You are leaking fluid from your vagina.  You have spotting or bleeding from your vagina.  You have severe abdominal cramping or pain.  You have rapid weight gain or loss.  You vomit blood or material that looks like coffee grounds.  You are exposed to German measles and have never had them.  You are exposed to fifth disease or chickenpox.  You develop a severe headache.  You have shortness of breath.  You have any kind of trauma, such as from a fall or a car accident. Document Released: 05/26/2001 Document Revised: 10/16/2013 Document Reviewed: 04/11/2013 Exit[employer] Patient Information 2015 [employer]. This information is not intended to replace advice given to you by your health care provider. Make sure you discuss any questions you have with your health care provider.  

## 2014-01-26 NOTE — MAU Note (Signed)
Rollover from US, waiting on report

## 2014-01-26 NOTE — MAU Provider Note (Signed)
  History     CSN: [id number]  Arrival date and time: 01/26/14 [id number]   First Provider Initiated Contact with Patient 01/26/14 1031      Chief Complaint  Patient presents with  . Follow-up   HPI Comments: Bailey Hicks 23 y.o. G1P0 presents to MAU following ultrasound for viability. She was seen 8/6 and had quant 442 with no IUGS or Yolk sac. She was scheduled today for follow up. She denies any pain or bleeding. [facility]     No past medical history on file.  Past Surgical History  Procedure Laterality Date  . Wisdom tooth extraction    . Anterior cruciate ligament repair  Left knee    No family history on file.  History  Substance Use Topics  . Smoking status: Never Smoker   . Smokeless tobacco: Never Used  . Alcohol Use: No    Allergies: Not on File  No prescriptions prior to admission    Review of Systems  Constitutional: Negative.   HENT: Negative.   Eyes: Negative.   Respiratory: Negative.   Cardiovascular: Negative.   Gastrointestinal: Negative.   Genitourinary: Negative.   Musculoskeletal: Negative.   Skin: Negative.   Neurological: Negative.   Psychiatric/Behavioral: Negative.    Physical Exam   Blood pressure 135/66, pulse 74, temperature 98.6 F (37 C), temperature source Oral, resp. rate 18, last menstrual period 12/14/2013, SpO2 100.00%.  Physical Exam  Constitutional: She is oriented to person, place, and time. She appears well-developed and well-nourished. No distress.  HENT:  Head: Normocephalic and atraumatic.  Eyes: Conjunctivae and EOM are normal. Pupils are equal, round, and reactive to light.  Musculoskeletal: Normal range of motion.  Neurological: She is alert and oriented to person, place, and time.  Skin: Skin is warm and dry.  Psychiatric: She has a normal mood and affect. Her behavior is normal. Judgment and thought content normal.    MAU Course  Procedures  MDM   Assessment and Plan   A: Pregnancy    P: Advised to take PNV daily Avoid harmful substances Schedule NOB with [facility]  Return to MAU with pain/ bleeding  Bailey Hicks, Bailey Hicks 01/26/2014, 11:37 AM

## 2014-02-01 ENCOUNTER — Inpatient Hospital Stay (HOSPITAL_COMMUNITY)
Admission: AD | Admit: 2014-02-01 | Discharge: 2014-02-01 | Disposition: A | Discharge status: Home / Self Care | Payer: Medicaid Other | Source: Ambulatory Visit | Attending: Obstetrics and Gynecology | Admitting: Obstetrics and Gynecology

## 2014-02-01 ENCOUNTER — Inpatient Hospital Stay (HOSPITAL_COMMUNITY): Payer: Medicaid Other

## 2014-02-01 ENCOUNTER — Encounter (HOSPITAL_COMMUNITY): Payer: Self-pay | Admitting: *Deleted

## 2014-02-01 DIAGNOSIS — O209 Hemorrhage in early pregnancy, unspecified: Secondary | ICD-10-CM | POA: Diagnosis not present

## 2014-02-01 LAB — URINALYSIS, ROUTINE W REFLEX MICROSCOPIC
Bilirubin Urine: NEGATIVE
Glucose, UA: NEGATIVE mg/dL
Ketones, ur: NEGATIVE mg/dL
LEUKOCYTES UA: NEGATIVE
Nitrite: NEGATIVE
PROTEIN: NEGATIVE mg/dL
Specific Gravity, Urine: >1.03 — ABNORMAL HIGH (ref 1.005–1.030)
Urobilinogen, UA: 0.2 mg/dL (ref 0.0–1.0)
pH: 6 (ref 5.0–8.0)

## 2014-02-01 LAB — URINE MICROSCOPIC-ADD ON

## 2014-02-01 NOTE — MAU Provider Note (Signed)
History     CSN: [id number]  Arrival date and time: 02/01/14 0224   First Provider Initiated Contact with Patient 02/01/14 0246      No chief complaint on file.  HPI [name] is a 23 y.o. G1P0 at [redacted]w[redacted]d who presents to MAU today with complaint of vaginal bleeding x 2 hours. She states that bleeding is more than spotting, but lighter than a period. She denies vaginal discharge, abdominal pain or fever. She has had some nausea without vomiting or diarrhea. She states last intercourse was 2 days ago. Patient has had [place] previously with IUGS and YS at [redacted]w[redacted]d. Patient denies previous bleeding during this pregnancy.    OB History   Grav Para Term Preterm Abortions TAB SAB Ect Mult Living   1               History reviewed. No pertinent past medical history.  Past Surgical History  Procedure Laterality Date  . Wisdom tooth extraction    . Anterior cruciate ligament repair  Left knee    History reviewed. No pertinent family history.  History  Substance Use Topics  . Smoking status: Never Smoker   . Smokeless tobacco: Never Used  . Alcohol Use: No    Allergies: No Known Allergies  Prescriptions prior to admission  Medication Sig Dispense Refill  . metroNIDAZOLE (FLAGYL) 500 MG tablet Take 1 tablet (500 mg total) by mouth 2 (two) times daily.  14 tablet  0  . Prenatal Vit-Fe Fumarate-FA (PRENATAL MULTIVITAMIN) TABS tablet Take 1 tablet by mouth daily at 12 noon.        Review of Systems  Constitutional: Negative for fever and malaise/fatigue.  Gastrointestinal: Positive for nausea. Negative for vomiting, abdominal pain and diarrhea.  Genitourinary: Negative for dysuria, urgency and frequency.       + vaginal bleeding Neg - vaginal discharge   Physical Exam   Blood pressure 133/71, pulse 86, temperature 99.4 F (37.4 C), temperature source Oral, resp. rate 18, height 5\' 4"  (1.626 m), weight 205 lb (92.987 kg), last menstrual period 12/14/2013.  Physical Exam   Constitutional: She is oriented to person, place, and time. She appears well-developed and well-nourished. No distress.  HENT:  Head: Normocephalic.  Cardiovascular: Normal rate.   Respiratory: Effort normal.  GI: Soft. She exhibits no distension and no mass. There is no tenderness. There is no rebound and no guarding.  Genitourinary: Uterus is not enlarged and not tender. Cervix exhibits no motion tenderness, no discharge and no friability. Right adnexum displays no mass and no tenderness. Left adnexum displays no mass and no tenderness. There is bleeding (small amount of dark brown blood noted in the vaginal vault and at the cervical os) around the vagina. No vaginal discharge found.  Cervix: closed, thick  Neurological: She is alert and oriented to person, place, and time.  Skin: Skin is warm and dry. No erythema.  Psychiatric: She has a normal mood and affect.   Results for orders placed during the hospital encounter of 02/01/14 (from the past 24 hour(s))  URINALYSIS, ROUTINE W REFLEX MICROSCOPIC     Status: Abnormal   Collection Time    02/01/14  2:39 AM      Result Value Ref Range   Color, Urine ORANGE (*) YELLOW   APPearance CLEAR  CLEAR   Specific Gravity, Urine >1.030 (*) 1.005 - 1.030   pH 6.0  5.0 - 8.0   Glucose, UA NEGATIVE  NEGATIVE mg/dL   Hgb  urine dipstick LARGE (*) NEGATIVE   Bilirubin Urine NEGATIVE  NEGATIVE   Ketones, ur NEGATIVE  NEGATIVE mg/dL   Protein, ur NEGATIVE  NEGATIVE mg/dL   Urobilinogen, UA 0.2  0.0 - 1.0 mg/dL   Nitrite NEGATIVE  NEGATIVE   Leukocytes, UA NEGATIVE  NEGATIVE  URINE MICROSCOPIC-ADD ON     Status: Abnormal   Collection Time    02/01/14  2:39 AM      Result Value Ref Range   Squamous Epithelial / LPF RARE  RARE   WBC, UA 0-2  <3 WBC/hpf   RBC / HPF 11-20  <3 RBC/hpf   Bacteria, UA FEW (*) RARE   [place] Ob Transvaginal  02/01/2014   CLINICAL DATA:  Vaginal bleeding.  Confirm viability  EXAM: TRANSVAGINAL OB ULTRASOUND  TECHNIQUE:  Transvaginal ultrasound was performed for complete evaluation of the gestation as well as the maternal uterus, adnexal regions, and pelvic cul-de-sac.  COMPARISON:  01/26/2014  FINDINGS: Intrauterine gestational sac: Visualized/normal in shape.  Yolk sac:  Present  Embryo:  Present  Cardiac Activity: Present  Heart Rate: 116 bpm  CRL:   5.6  mm   6 w 3 d                  US EDC: 09/24/2014  Maternal uterus/adnexae: Simple cyst in the right ovary, likely follicular. The cyst measures up to 2.3 cm. The left ovary is unremarkable. No adnexal mass or free pelvic fluid. No subchronic hemorrhage.  IMPRESSION: Single, living intrauterine gestation with estimated age 72 weeks 3 days. No adverse findings.   Electronically Signed   By: Tiburcio PeaJonathan  Watts M.D.   On: 02/01/2014 05:04    MAU Course  Procedures None  MDM US today  Assessment and Plan  A: SIUP at 6136w3d Vaginal bleeding in pregnancy prior to [redacted] weeks gestation  P: Discharge home Bleeding precautions discussed Patient advised to make an appointment to start prenatal care with [facility] as planned Patient may return to MAU as needed or if her condition were to change or worsen   Freddi StarrJulie N Ethier, PA-C  02/01/2014, 5:16 AM

## 2014-02-01 NOTE — Discharge Instructions (Signed)

## 2014-02-01 NOTE — MAU Note (Signed)
Patient reports dark red vaginal bleeding. States she spots intermittently but starting bleeding heavier tonight. Sexual intercourse two days ago. Has been cramping off and on.

## 2014-02-02 NOTE — MAU Provider Note (Signed)
Attestation of Attending Supervision of Advanced Practitioner: Evaluation and management procedures were performed by the PA/NP/CNM/OB Fellow under my supervision/collaboration. Chart reviewed and agree with management and plan.  Oaklee Esther V 02/02/2014 7:50 AM

## 2014-03-01 ENCOUNTER — Ambulatory Visit (INDEPENDENT_AMBULATORY_CARE_PROVIDER_SITE_OTHER): Payer: Medicaid Other | Admitting: Obstetrics

## 2014-03-01 ENCOUNTER — Encounter: Payer: Self-pay | Admitting: Obstetrics

## 2014-03-01 VITALS — BP 141/92 | HR 92 | Temp 98.7°F | Wt 202.0 lb

## 2014-03-01 DIAGNOSIS — O3680X0 Pregnancy with inconclusive fetal viability, not applicable or unspecified: Secondary | ICD-10-CM

## 2014-03-01 DIAGNOSIS — O3680X1 Pregnancy with inconclusive fetal viability, fetus 1: Secondary | ICD-10-CM

## 2014-03-01 DIAGNOSIS — Z3401 Encounter for supervision of normal first pregnancy, first trimester: Secondary | ICD-10-CM

## 2014-03-01 NOTE — Addendum Note (Signed)
Addended by: Henriette Combs on: 03/01/2014 06:18 PM   Modules accepted: Orders

## 2014-03-01 NOTE — Progress Notes (Signed)
  Subjective:    Bailey Hicks is a 23 y.o. female being seen today for her obstetrical visit. She is at [redacted]w[redacted]d gestation. Patient reports: no complaints.  Problem List Items Addressed This Visit   None    Visit Diagnoses   Encounter for supervision of normal first pregnancy in first trimester    -  Primary    Relevant Orders       POCT urinalysis dipstick       Obstetric panel       HIV antibody       Hemoglobinopathy evaluation       Varicella zoster antibody, IgG       Vit D  25 hydroxy (rtn osteoporosis monitoring)    Encounter to determine fetal viability of pregnancy, fetus 1        Relevant Orders       US OB Comp Less 14 Wks      There are no active problems to display for this patient.   Objective:     BP 141/92  Pulse 92  Temp(Src) 98.7 F (37.1 C)  Wt 202 lb (91.627 kg)  LMP 12/14/2013 Uterine Size: Below umbilicus     Assessment:    Pregnancy @ [redacted]w[redacted]d  weeks Doing well    Plan:    Problem list reviewed and updated. Labs reviewed.  Follow up in 2 weeks. FIRST/CF mutation testing/NIPT/QUAD SCREEN/fragile X/Ashkenazi Jewish population testing/Spinal muscular atrophy discussed: requested. Role of ultrasound in pregnancy discussed; fetal survey: requested. Amniocentesis discussed: not indicated.

## 2014-03-01 NOTE — Addendum Note (Signed)
Addended by: Henriette Combs on: 03/01/2014 05:49 PM   Modules accepted: Orders

## 2014-03-02 ENCOUNTER — Ambulatory Visit (HOSPITAL_COMMUNITY)
Admission: RE | Admit: 2014-03-02 | Discharge: 2014-03-02 | Disposition: A | Discharge status: Home / Self Care | Payer: Medicaid Other | Source: Ambulatory Visit | Attending: Obstetrics | Admitting: Obstetrics

## 2014-03-02 DIAGNOSIS — O3680X0 Pregnancy with inconclusive fetal viability, not applicable or unspecified: Secondary | ICD-10-CM | POA: Diagnosis not present

## 2014-03-02 DIAGNOSIS — Z3689 Encounter for other specified antenatal screening: Secondary | ICD-10-CM | POA: Diagnosis not present

## 2014-03-02 DIAGNOSIS — O3680X1 Pregnancy with inconclusive fetal viability, fetus 1: Secondary | ICD-10-CM

## 2014-03-02 LAB — OBSTETRIC PANEL
ANTIBODY SCREEN: NEGATIVE
Basophils Absolute: 0 10*3/uL (ref 0.0–0.1)
Basophils Relative: 0 % (ref 0–1)
Eosinophils Absolute: 0.1 10*3/uL (ref 0.0–0.7)
Eosinophils Relative: 1 % (ref 0–5)
HCT: 38.6 % (ref 36.0–46.0)
HEMOGLOBIN: 13.2 g/dL (ref 12.0–15.0)
HEP B S AG: NEGATIVE
LYMPHS ABS: 2.5 10*3/uL (ref 0.7–4.0)
Lymphocytes Relative: 29 % (ref 12–46)
MCH: 30 pg (ref 26.0–34.0)
MCHC: 34.2 g/dL (ref 30.0–36.0)
MCV: 87.7 fL (ref 78.0–100.0)
MONO ABS: 0.8 10*3/uL (ref 0.1–1.0)
MONOS PCT: 9 % (ref 3–12)
NEUTROS ABS: 5.2 10*3/uL (ref 1.7–7.7)
Neutrophils Relative %: 61 % (ref 43–77)
Platelets: 247 10*3/uL (ref 150–400)
RBC: 4.4 MIL/uL (ref 3.87–5.11)
RDW: 13.7 % (ref 11.5–15.5)
RH TYPE: POSITIVE
Rubella: 3.18 Index — ABNORMAL HIGH (ref <0.90)
WBC: 8.5 10*3/uL (ref 4.0–10.5)

## 2014-03-02 LAB — VARICELLA ZOSTER ANTIBODY, IGG: VARICELLA IGG: 219.1 {index} — AB (ref <135.00)

## 2014-03-02 LAB — HIV ANTIBODY (ROUTINE TESTING W REFLEX): HIV 1&2 Ab, 4th Generation: NONREACTIVE

## 2014-03-02 LAB — TSH: TSH: 0.247 u[IU]/mL — AB (ref 0.350–4.500)

## 2014-03-02 LAB — VITAMIN D 25 HYDROXY (VIT D DEFICIENCY, FRACTURES): VIT D 25 HYDROXY: 35 ng/mL (ref 30–89)

## 2014-03-03 LAB — CULTURE, OB URINE: Colony Count: 70000

## 2014-03-05 LAB — HEMOGLOBINOPATHY EVALUATION
HEMOGLOBIN OTHER: 0 %
HGB A2 QUANT: 2.4 % (ref 2.2–3.2)
Hgb A: 97.6 % (ref 96.8–97.8)
Hgb F Quant: 0 % (ref 0.0–2.0)
Hgb S Quant: 0 %

## 2014-03-06 ENCOUNTER — Other Ambulatory Visit: Payer: Self-pay | Admitting: Obstetrics

## 2014-03-06 DIAGNOSIS — E038 Other specified hypothyroidism: Secondary | ICD-10-CM

## 2014-03-06 LAB — POCT URINALYSIS DIPSTICK
BILIRUBIN UA: NEGATIVE
Blood, UA: NEGATIVE
Glucose, UA: NEGATIVE
KETONES UA: NEGATIVE
LEUKOCYTES UA: NEGATIVE
Nitrite, UA: NEGATIVE
PH UA: 5
Spec Grav, UA: 1.025
Urobilinogen, UA: NEGATIVE

## 2014-03-12 ENCOUNTER — Encounter (HOSPITAL_COMMUNITY): Payer: Self-pay | Admitting: *Deleted

## 2014-03-12 ENCOUNTER — Inpatient Hospital Stay (HOSPITAL_COMMUNITY)
Admission: AD | Admit: 2014-03-12 | Discharge: 2014-03-12 | Disposition: A | Discharge status: Home / Self Care | Payer: Medicaid Other | Source: Ambulatory Visit | Attending: Obstetrics | Admitting: Obstetrics

## 2014-03-12 DIAGNOSIS — N949 Unspecified condition associated with female genital organs and menstrual cycle: Secondary | ICD-10-CM | POA: Diagnosis not present

## 2014-03-12 DIAGNOSIS — O2390 Unspecified genitourinary tract infection in pregnancy, unspecified trimester: Secondary | ICD-10-CM | POA: Insufficient documentation

## 2014-03-12 DIAGNOSIS — B373 Candidiasis of vulva and vagina: Secondary | ICD-10-CM | POA: Diagnosis not present

## 2014-03-12 DIAGNOSIS — B3731 Acute candidiasis of vulva and vagina: Secondary | ICD-10-CM | POA: Diagnosis not present

## 2014-03-12 DIAGNOSIS — R109 Unspecified abdominal pain: Secondary | ICD-10-CM | POA: Diagnosis present

## 2014-03-12 DIAGNOSIS — R102 Pelvic and perineal pain: Secondary | ICD-10-CM

## 2014-03-12 LAB — CBC WITH DIFFERENTIAL/PLATELET
Basophils Absolute: 0 10*3/uL (ref 0.0–0.1)
Basophils Relative: 0 % (ref 0–1)
Eosinophils Absolute: 0.1 10*3/uL (ref 0.0–0.7)
Eosinophils Relative: 1 % (ref 0–5)
HCT: 38.9 % (ref 36.0–46.0)
Hemoglobin: 13.6 g/dL (ref 12.0–15.0)
Lymphocytes Relative: 37 % (ref 12–46)
Lymphs Abs: 3.3 10*3/uL (ref 0.7–4.0)
MCH: 30.6 pg (ref 26.0–34.0)
MCHC: 35 g/dL (ref 30.0–36.0)
MCV: 87.6 fL (ref 78.0–100.0)
Monocytes Absolute: 0.8 10*3/uL (ref 0.1–1.0)
Monocytes Relative: 9 % (ref 3–12)
Neutro Abs: 4.6 10*3/uL (ref 1.7–7.7)
Neutrophils Relative %: 53 % (ref 43–77)
Platelets: 235 10*3/uL (ref 150–400)
RBC: 4.44 MIL/uL (ref 3.87–5.11)
RDW: 12.6 % (ref 11.5–15.5)
WBC: 8.7 10*3/uL (ref 4.0–10.5)

## 2014-03-12 LAB — URINALYSIS, ROUTINE W REFLEX MICROSCOPIC
BILIRUBIN URINE: NEGATIVE
Glucose, UA: NEGATIVE mg/dL
KETONES UR: NEGATIVE mg/dL
LEUKOCYTES UA: NEGATIVE
Nitrite: NEGATIVE
PH: 8.5 — AB (ref 5.0–8.0)
PROTEIN: NEGATIVE mg/dL
Specific Gravity, Urine: 1.01 (ref 1.005–1.030)
Urobilinogen, UA: 0.2 mg/dL (ref 0.0–1.0)

## 2014-03-12 LAB — WET PREP, GENITAL
Clue Cells Wet Prep HPF POC: NONE SEEN
Trich, Wet Prep: NONE SEEN

## 2014-03-12 LAB — URINE MICROSCOPIC-ADD ON

## 2014-03-12 LAB — BASIC METABOLIC PANEL WITH GFR
Anion gap: 14 (ref 5–15)
BUN: 8 mg/dL (ref 6–23)
CO2: 25 meq/L (ref 19–32)
Calcium: 10.1 mg/dL (ref 8.4–10.5)
Chloride: 98 meq/L (ref 96–112)
Creatinine, Ser: 0.77 mg/dL (ref 0.50–1.10)
GFR calc Af Amer: >90 mL/min
GFR calc non Af Amer: >90 mL/min
Glucose, Bld: 89 mg/dL (ref 70–99)
Potassium: 4.4 meq/L (ref 3.7–5.3)
Sodium: 137 meq/L (ref 137–147)

## 2014-03-12 MED ORDER — FLUCONAZOLE 150 MG PO TABS: ORAL_TABLET | ORAL | Status: DC

## 2014-03-12 NOTE — MAU Note (Signed)
Been having very bad abd pain and pain in low back for a few days.  Did not become severe until this morning, pain woke her.  No bleeding.  Pinkish spotting last night and a little today.

## 2014-03-12 NOTE — Discharge Instructions (Signed)

## 2014-03-12 NOTE — MAU Provider Note (Signed)
None     Chief Complaint:  Abdominal Pain and Back Pain   Dorrie Cocuzza is  23 y.o. G1P0 at [redacted]w[redacted]d presents complaining of Abdominal Pain and Back Pain She has been having intermittent lower abdominal pain for 3 days.  She states the pain occurs at random moments, is sharp, and lasts about 15 minutes.  Nothing precipitates nor relieves the pain. Pain is also in lower back on occasion, not necessarily r/t abdominal pain.  Bowels normal.  Had [place] last week which showed normal 11 week IUP.   Obstetrical/Gynecological History: OB History   Grav Para Term Preterm Abortions TAB SAB Ect Mult Living   1              Past Medical History: History reviewed. No pertinent past medical history.  Past Surgical History: Past Surgical History  Procedure Laterality Date  . Wisdom tooth extraction    . Anterior cruciate ligament repair  Left knee    Family History: History reviewed. No pertinent family history.  Social History: History  Substance Use Topics  . Smoking status: Never Smoker   . Smokeless tobacco: Never Used  . Alcohol Use: No    Allergies: No Known Allergies  Meds:  Prescriptions prior to admission  Medication Sig Dispense Refill  . loratadine (CLARITIN) 10 MG tablet Take 10 mg by mouth daily as needed for allergies.      . Prenatal Vit-Fe Fumarate-FA (PRENATAL MULTIVITAMIN) TABS tablet Take 1 tablet by mouth daily at 12 noon.        Review of Systems   Constitutional: Negative for fever and chills Eyes: Negative for visual disturbances Respiratory: Negative for shortness of breath, dyspnea Cardiovascular: Negative for chest pain or palpitations  Gastrointestinal: Negative for vomiting, diarrhea and constipation Genitourinary: Negative for dysuria and urgency Musculoskeletal: Negative for joint pain, myalgias  Neurological: Negative for dizziness and headaches     Physical Exam  Blood pressure 129/71, pulse 92, temperature 99.3 F (37.4 C), temperature  source Oral, resp. rate 18, height  (1.6 m), weight 89.359 kg (197 lb), last menstrual period 12/14/2013. GENERAL: Well-developed, well-nourished female in no acute distress.  LUNGS: Clear to auscultation bilaterally.  HEART: Regular rate and rhythm. ABDOMEN: Soft, nontender, nondistended,Not tender from external palpation  BACK:  No CVAT EXTREMITIES: Nontender, no edema, 2+ distal pulses. DTR's 2+ PELVIC:  SSE:  Brown discharge and some white plaques on vaginal sidewalls.  Cx non friable. Uterus and cervix nontender, no CMT.  During bimanual, the pain is noted to be in the area of the broad ligament.  FHT 150's via doppler  Labs: Results for orders placed during the hospital encounter of 03/12/14 (from the past 24 hour(s))  URINALYSIS, ROUTINE W REFLEX MICROSCOPIC   Collection Time    03/12/14  6:48 PM      Result Value Ref Range   Color, Urine YELLOW  YELLOW   APPearance CLEAR  CLEAR   Specific Gravity, Urine 1.010  1.005 - 1.030   pH 8.5 (*) 5.0 - 8.0   Glucose, UA NEGATIVE  NEGATIVE mg/dL   Hgb urine dipstick TRACE (*) NEGATIVE   Bilirubin Urine NEGATIVE  NEGATIVE   Ketones, ur NEGATIVE  NEGATIVE mg/dL   Protein, ur NEGATIVE  NEGATIVE mg/dL   Urobilinogen, UA 0.2  0.0 - 1.0 mg/dL   Nitrite NEGATIVE  NEGATIVE   Leukocytes, UA NEGATIVE  NEGATIVE  URINE MICROSCOPIC-ADD ON   Collection Time    03/12/14  6:48 PM  Result Value Ref Range   Squamous Epithelial / LPF RARE  RARE   WBC, UA 0-2  <3 WBC/hpf   RBC / HPF 0-2  <3 RBC/hpf   Bacteria, UA RARE  RARE   Urine-Other AMORPHOUS URATES/PHOSPHATES    CBC WITH DIFFERENTIAL   Collection Time    03/12/14  7:08 PM      Result Value Ref Range   WBC 8.7  4.0 - 10.5 K/uL   RBC 4.44  3.87 - 5.11 MIL/uL   Hemoglobin 13.6  12.0 - 15.0 g/dL   HCT 13.0  86.5 - 78.4 %   MCV 87.6  78.0 - 100.0 fL   MCH 30.6  26.0 - 34.0 pg   MCHC 35.0  30.0 - 36.0 g/dL   RDW 69.6  29.5 - 28.4 %   Platelets 235  150 - 400 K/uL   Neutrophils  Relative % 53  43 - 77 %   Neutro Abs 4.6  1.7 - 7.7 K/uL   Lymphocytes Relative 37  12 - 46 %   Lymphs Abs 3.3  0.7 - 4.0 K/uL   Monocytes Relative 9  3 - 12 %   Monocytes Absolute 0.8  0.1 - 1.0 K/uL   Eosinophils Relative 1  0 - 5 %   Eosinophils Absolute 0.1  0.0 - 0.7 K/uL   Basophils Relative 0  0 - 1 %   Basophils Absolute 0.0  0.0 - 0.1 K/uL  BASIC METABOLIC PANEL   Collection Time    03/12/14  7:08 PM      Result Value Ref Range   Sodium 137  137 - 147 mEq/L   Potassium 4.4  3.7 - 5.3 mEq/L   Chloride 98  96 - 112 mEq/L   CO2 25  19 - 32 mEq/L   Glucose, Bld 89  70 - 99 mg/dL   BUN 8  6 - 23 mg/dL   Creatinine, Ser 1.32  0.50 - 1.10 mg/dL   Calcium 44.0  8.4 - 10.2 mg/dL   GFR calc non Af Amer >90  >90 mL/min   GFR calc Af Amer >90  >90 mL/min   Anion gap 14  5 - 15  WET PREP, GENITAL   Collection Time    03/12/14  8:05 PM      Result Value Ref Range   Yeast Wet Prep HPF POC FEW (*) NONE SEEN   Trich, Wet Prep NONE SEEN  NONE SEEN   Clue Cells Wet Prep HPF POC NONE SEEN  NONE SEEN   WBC, Wet Prep HPF POC FEW (*) NONE SEEN   Imaging Studies:  US Ob Transvaginal  03/02/2014   CLINICAL DATA:  No fetal heart tones on office exam.  EXAM: TRANSVAGINAL OB ULTRASOUND  TECHNIQUE: Transvaginal ultrasound was performed for complete evaluation of the gestation as well as the maternal uterus, adnexal regions, and pelvic cul-de-sac.  COMPARISON:  02/01/2014  FINDINGS: Intrauterine gestational sac: Visualized/normal in shape.  Yolk sac:  Present  Embryo:  Present  Cardiac Activity: Present  Heart Rate: 156 bpm  CRL:   41  Mm   11 w 0 d                  [place] EDC: 09/21/2014  Maternal uterus/adnexae: Normal bilateral ovaries. No subchorionic hemorrhage. No free fluid.  IMPRESSION: Single live intrauterine gestation 11 weeks 1 day by LMP.   Electronically Signed   By: Annia Belt M.D.   On: 03/02/2014  16:44    Assessment: Fantashia Shupert is  23 y.o. G1P0 at [redacted]w[redacted]d presents with ligament  pain and yeast infection.  Plan: Treat yeast.  Reassured pt  CRESENZO-DISHMAN,Aadan Chenier 9/28/20159:08 PM

## 2014-03-13 LAB — GC/CHLAMYDIA PROBE AMP
CT Probe RNA: NEGATIVE
GC PROBE AMP APTIMA: NEGATIVE

## 2014-03-15 ENCOUNTER — Ambulatory Visit (INDEPENDENT_AMBULATORY_CARE_PROVIDER_SITE_OTHER): Payer: Medicaid Other | Admitting: Obstetrics

## 2014-03-15 ENCOUNTER — Encounter: Payer: Self-pay | Admitting: Obstetrics

## 2014-03-15 VITALS — BP 131/79 | HR 87 | Temp 100.0°F | Wt 200.0 lb

## 2014-03-15 DIAGNOSIS — K219 Gastro-esophageal reflux disease without esophagitis: Secondary | ICD-10-CM

## 2014-03-15 DIAGNOSIS — Z3401 Encounter for supervision of normal first pregnancy, first trimester: Secondary | ICD-10-CM

## 2014-03-15 LAB — POCT URINALYSIS DIPSTICK
BILIRUBIN UA: NEGATIVE
Glucose, UA: NEGATIVE
Ketones, UA: NEGATIVE
Leukocytes, UA: NEGATIVE
Nitrite, UA: NEGATIVE
PH UA: 5
RBC UA: NEGATIVE
SPEC GRAV UA: 1.025
Urobilinogen, UA: NEGATIVE

## 2014-03-15 MED ORDER — OMEPRAZOLE 20 MG PO CPDR: 20.0000 mg | DELAYED_RELEASE_CAPSULE | Freq: Every day | ORAL | Status: DC

## 2014-03-15 NOTE — Progress Notes (Signed)
  Subjective:    Bailey Hicks is a 23 y.o. female being seen today for her obstetrical visit. She is at 7315w2d gestation. Patient reports: heartburn.  Problem List Items Addressed This Visit   None    Visit Diagnoses   Supervision of normal first pregnancy in first trimester    -  Primary    Relevant Orders       POCT urinalysis dipstick (Completed)    GERD without esophagitis        Relevant Medications       omeprazole (PRILOSEC) capsule      There are no active problems to display for this patient.   Objective:     BP 131/79  Pulse 87  Temp(Src) 100 F (37.8 C)  Wt 200 lb (90.719 kg)  LMP 12/14/2013 Uterine Size: Below umbilicus     Assessment:    Pregnancy @ 8215w2d  weeks Doing well    Plan:    Problem list reviewed and updated. Labs reviewed.  Follow up in 4 weeks. FIRST/CF mutation testing/NIPT/QUAD SCREEN/fragile X/Ashkenazi Jewish population testing/Spinal muscular atrophy discussed: requested. Role of ultrasound in pregnancy discussed; fetal survey: requested. Amniocentesis discussed: not indicated.

## 2014-04-12 ENCOUNTER — Ambulatory Visit (INDEPENDENT_AMBULATORY_CARE_PROVIDER_SITE_OTHER): Payer: Medicaid Other | Admitting: Obstetrics

## 2014-04-12 VITALS — BP 131/84 | HR 89 | Temp 99.1°F | Wt 202.0 lb

## 2014-04-12 DIAGNOSIS — Z3402 Encounter for supervision of normal first pregnancy, second trimester: Secondary | ICD-10-CM

## 2014-04-12 LAB — POCT URINALYSIS DIPSTICK
Bilirubin, UA: NEGATIVE
Glucose, UA: NEGATIVE
Ketones, UA: NEGATIVE
Nitrite, UA: NEGATIVE
Protein, UA: NEGATIVE
RBC UA: NEGATIVE
SPEC GRAV UA: 1.01
Urobilinogen, UA: NEGATIVE
pH, UA: 7

## 2014-04-12 NOTE — Progress Notes (Signed)
Patient feeling stretching and pressure- but nothing that is alarming to her

## 2014-04-13 ENCOUNTER — Telehealth: Payer: Self-pay

## 2014-04-13 ENCOUNTER — Encounter: Payer: Self-pay | Admitting: Obstetrics

## 2014-04-13 NOTE — Progress Notes (Signed)
  Subjective:    Vladimir Fasterlicia Saric is a 23 y.o. female being seen today for her obstetrical visit. She is at 684w3d gestation. Patient reports: no complaints.  Problem List Items Addressed This Visit   None    Visit Diagnoses   Encounter for supervision of normal first pregnancy in second trimester    -  Primary    Relevant Orders       POCT urinalysis dipstick (Completed)      Patient Active Problem List   Diagnosis Date Noted  . GERD without esophagitis 03/15/2014    Objective:     BP 131/84  Pulse 89  Temp(Src) 99.1 F (37.3 C)  Wt 202 lb (91.627 kg)  LMP 12/14/2013 Uterine Size: Below umbilicus     Assessment:    Pregnancy @ 664w3d  weeks Doing well    Plan:    Problem list reviewed and updated. Labs reviewed.  Follow up in 4 weeks. FIRST/CF mutation testing/NIPT/QUAD SCREEN/fragile X/Ashkenazi Jewish population testing/Spinal muscular atrophy discussed: declined. Role of ultrasound in pregnancy discussed; fetal survey: requested. Amniocentesis discussed: not indicated.

## 2014-04-13 NOTE — Telephone Encounter (Signed)
[facility] message with patient [facility] at 1pm arrive by 12:45pm

## 2014-04-16 ENCOUNTER — Inpatient Hospital Stay (HOSPITAL_COMMUNITY)
Admission: AD | Admit: 2014-04-16 | Discharge: 2014-04-20 | DRG: 779 | Disposition: A | Discharge status: Home / Self Care | Payer: Medicaid Other | Source: Ambulatory Visit | Attending: Obstetrics & Gynecology | Admitting: Obstetrics & Gynecology

## 2014-04-16 ENCOUNTER — Inpatient Hospital Stay (HOSPITAL_COMMUNITY): Payer: Medicaid Other

## 2014-04-16 ENCOUNTER — Encounter (HOSPITAL_COMMUNITY): Payer: Self-pay

## 2014-04-16 DIAGNOSIS — O039 Complete or unspecified spontaneous abortion without complication: Secondary | ICD-10-CM

## 2014-04-16 DIAGNOSIS — Z332 Encounter for elective termination of pregnancy: Principal | ICD-10-CM

## 2014-04-16 DIAGNOSIS — Z3A17 17 weeks gestation of pregnancy: Secondary | ICD-10-CM | POA: Diagnosis present

## 2014-04-16 DIAGNOSIS — O3432 Maternal care for cervical incompetence, second trimester: Secondary | ICD-10-CM | POA: Diagnosis present

## 2014-04-16 DIAGNOSIS — O3430 Maternal care for cervical incompetence, unspecified trimester: Secondary | ICD-10-CM | POA: Diagnosis present

## 2014-04-16 DIAGNOSIS — O42912 Preterm premature rupture of membranes, unspecified as to length of time between rupture and onset of labor, second trimester: Secondary | ICD-10-CM | POA: Diagnosis present

## 2014-04-16 DIAGNOSIS — IMO0001 Reserved for inherently not codable concepts without codable children: Secondary | ICD-10-CM

## 2014-04-16 DIAGNOSIS — O4290 Premature rupture of membranes, unspecified as to length of time between rupture and onset of labor, unspecified weeks of gestation: Secondary | ICD-10-CM

## 2014-04-16 LAB — CBC
HEMATOCRIT: 35.2 % — AB (ref 36.0–46.0)
Hemoglobin: 12 g/dL (ref 12.0–15.0)
MCH: 30 pg (ref 26.0–34.0)
MCHC: 34.1 g/dL (ref 30.0–36.0)
MCV: 88 fL (ref 78.0–100.0)
PLATELETS: 211 10*3/uL (ref 150–400)
RBC: 4 MIL/uL (ref 3.87–5.11)
RDW: 13.4 % (ref 11.5–15.5)
WBC: 9.9 10*3/uL (ref 4.0–10.5)

## 2014-04-16 LAB — URINALYSIS, ROUTINE W REFLEX MICROSCOPIC
Bilirubin Urine: NEGATIVE
Glucose, UA: NEGATIVE mg/dL
Ketones, ur: NEGATIVE mg/dL
Nitrite: NEGATIVE
Protein, ur: NEGATIVE mg/dL
SPECIFIC GRAVITY, URINE: 1.025 (ref 1.005–1.030)
UROBILINOGEN UA: 0.2 mg/dL (ref 0.0–1.0)
pH: 6 (ref 5.0–8.0)

## 2014-04-16 LAB — AMNISURE RUPTURE OF MEMBRANE (ROM) NOT AT ARMC: AMNISURE: POSITIVE

## 2014-04-16 LAB — URINE MICROSCOPIC-ADD ON

## 2014-04-16 MED ORDER — INFLUENZA VAC SPLIT QUAD 0.5 ML IM SUSY: 0.5000 mL | PREFILLED_SYRINGE | INTRAMUSCULAR | Status: DC

## 2014-04-16 NOTE — Plan of Care (Signed)
Problem: Phase I Progression Outcomes Goal: Bowel movement every 2 days Outcome: Completed/Met Date Met:  04/16/14 Goal: Pain controlled with appropriate interventions Outcome: Completed/Met Date Met:  04/16/14

## 2014-04-16 NOTE — H&P (Signed)
History     CSN: [id number]  Arrival date and time: 04/16/14 [id number]  First Provider Initiated Contact with Patient 04/16/14 0913    Chief Complaint  Patient presents with  . Abdominal Cramping  . leaking vaginal fluid    HPI  [name] is a 23 y.o. female G1P0 at 5457w6d who presents with vaginal discharge; ?ROM. She has been having leaking of fluid since last night; last intercourse was yesterday and it was unprotected. She started leaking prior to intercourse. She is also experiencing some contraction like pain that started 3 days ago. She feels the pain every 25 mins.    OB History    Gravida Para Term Preterm AB TAB SAB Ectopic Multiple Living   1               History reviewed. No pertinent past medical history.  Past Surgical History  Procedure Laterality Date  . Wisdom tooth extraction    . Anterior cruciate ligament repair  Left knee    History reviewed. No pertinent family history.  History  Substance Use Topics  . Smoking status: Never Smoker   . Smokeless tobacco: Never Used  . Alcohol Use: No    Allergies: No Known Allergies  Prescriptions prior to admission  Medication Sig Dispense Refill Last Dose  . Prenatal Vit-Fe Fumarate-FA (PRENATAL MULTIVITAMIN) TABS tablet Take 1 tablet by mouth daily at 12 noon.   Taking    Lab Results Last 48 Hours    Results for orders placed or performed during the hospital encounter of 04/16/14 (from the past 48 hour(s))  Amnisure rupture of membrane (rom) Status: None   Collection Time: 04/16/14 9:29 AM  Result Value Ref Range   Amnisure ROM POSITIVE        Review of Systems  Gastrointestinal: Positive for abdominal pain. Negative for nausea and vomiting.  Genitourinary:   + vaginal discharge; watery Denies vaginal bleeding   Physical Exam   Blood pressure 124/66, pulse 90, temperature 98.4 F (36.9  C), resp. rate 18, height 5\' 4"  (1.626 m), weight 91.627 kg (202 lb), last menstrual period 12/14/2013.  Physical Exam  Constitutional: She is oriented to person, place, and time. She appears well-developed and well-nourished. No distress.  HENT:  Head: Normocephalic.  Eyes: Pupils are equal, round, and reactive to light.  Neck: Neck supple.  Respiratory: Effort normal.  GI: Soft. She exhibits no distension.  Genitourinary:  Speculum exam: Vagina - Small amount of mucus like discharge pooling in the vagina; clear/white in color. No blood.  Cervix - bulging membranes in the cervix  Bimanual exam: Cervix 2 cm, 80% Chaperone present for exam.  Musculoskeletal: Normal range of motion.  Neurological: She is alert and oriented to person, place, and time.  Skin: Skin is warm. She is not diaphoretic.  Psychiatric: Her behavior is normal.    MAU Course  Procedures  None  MDM +fht amnisure sent; positive   Assessment and Plan   IUP @ 2557w6d--?Midtrimester PPROM--?amnisure result in the setting of cervical mucous, recent coitus DDx--Threatened abortion, cervical insufficiency  Management options reviewed: expectant, cytotec induction, rescue cerclage Observation for now

## 2014-04-16 NOTE — MAU Provider Note (Signed)
  History     CSN: [id number]  Arrival date and time: 04/16/14 [id number]   First Provider Initiated Contact with Patient 04/16/14 0913      Chief Complaint  Patient presents with  . Abdominal Cramping  . leaking vaginal fluid    HPI  [name] is a 23 y.o. female G1P0 at 9084w6d who presents with vaginal discharge; ?ROM. She has been having leaking of fluid since last night; last intercourse was yesterday and it was unprotected. She started leaking prior to intercourse. She is also experiencing some contraction like pain that started 3 days ago. She feels the pain every 25 mins.     OB History    Gravida Para Term Preterm AB TAB SAB Ectopic Multiple Living   1               History reviewed. No pertinent past medical history.  Past Surgical History  Procedure Laterality Date  . Wisdom tooth extraction    . Anterior cruciate ligament repair  Left knee    History reviewed. No pertinent family history.  History  Substance Use Topics  . Smoking status: Never Smoker   . Smokeless tobacco: Never Used  . Alcohol Use: No    Allergies: No Known Allergies  Prescriptions prior to admission  Medication Sig Dispense Refill Last Dose  . Prenatal Vit-Fe Fumarate-FA (PRENATAL MULTIVITAMIN) TABS tablet Take 1 tablet by mouth daily at 12 noon.   Taking   Results for orders placed or performed during the hospital encounter of 04/16/14 (from the past 48 hour(s))  Amnisure rupture of membrane (rom)     Status: None   Collection Time: 04/16/14  9:29 AM  Result Value Ref Range   Amnisure ROM POSITIVE      Review of Systems  Gastrointestinal: Positive for abdominal pain. Negative for nausea and vomiting.  Genitourinary:       + vaginal discharge; watery Denies vaginal bleeding    Physical Exam   Blood pressure 124/66, pulse 90, temperature 98.4 F (36.9 C), resp. rate 18, height 5\' 4"  (1.626 m), weight 91.627 kg (202 lb), last menstrual period 12/14/2013.  Physical Exam   Constitutional: She is oriented to person, place, and time. She appears well-developed and well-nourished. No distress.  HENT:  Head: Normocephalic.  Eyes: Pupils are equal, round, and reactive to light.  Neck: Neck supple.  Respiratory: Effort normal.  GI: Soft. She exhibits no distension.  Genitourinary:  Speculum exam: Vagina - Small amount of mucus like discharge pooling in the vagina; clear/white in color. No blood.  Cervix - bulging membranes in the cervix  Bimanual exam: Cervix 2 cm, 80% Chaperone present for exam.   Musculoskeletal: Normal range of motion.  Neurological: She is alert and oriented to person, place, and time.  Skin: Skin is warm. She is not diaphoretic.  Psychiatric: Her behavior is normal.    MAU Course  Procedures  None  MDM +fht amnisure sent; positive  Discussed physical exam with Dr. Tamela OddiJackson-Moore. Admit to 3rd floor. US to evaluate fluid  Plan of care discussed with patient; questions answered, support offered Assessment and Plan   A: 1. Premature rupture of membranes in pregnancy    P: Admit to the 3rd floor per Dr. Tamela OddiJackson-Moore. RN to place orders.   Iona HansenJennifer Irene Latorie Montesano, NP 04/16/2014 9:16 AM

## 2014-04-16 NOTE — Progress Notes (Signed)
Ms Alferd PateeRichmond is coping well for the time being; she is trying to take one day at a time.  She did not wish to visit at this time, but is aware of our on-going availability.  Please page as needs arise, [phone].  Agnes LawrenceChaplain Katy Chancey [place] 4:04 PM    04/16/14 1600  Clinical Encounter Type  Visited With Patient and family together  Visit Type Initial  Referral From Nurse

## 2014-04-16 NOTE — Plan of Care (Signed)
Problem: Consults Goal: Antepartum Patient Education Outcome: Completed/Met Date Met:  04/16/14 Goal: Birthing Suites Patient Information Press F2 to bring up selections list  Outcome: Completed/Met Date Met:  04/16/14  Inpatient induction Goal: Diabetes Guidelines per MD order/protocol Outcome: Not Applicable Date Met:  18/48/59 Goal: Lactation Consult Initiated if indicated Outcome: Not Applicable Date Met:  27/63/94 Goal: Home Health Consult Outcome: Not Applicable Date Met:  32/00/37 Goal: Neonatologist Consult Outcome: Not Applicable Date Met:  94/44/61 Goal: NICU Tour Outcome: Not Applicable Date Met:  90/12/22 Goal: Nutrition Consult-if indicated Outcome: Not Applicable Date Met:  41/14/64  Problem: Phase I Progression Outcomes Goal: Diabetic Assessment Outcome: Not Applicable Date Met:  31/42/76

## 2014-04-16 NOTE — Progress Notes (Signed)
Orders obtained to admit, will send pt to third floor.

## 2014-04-16 NOTE — Progress Notes (Signed)
Pt complained of mild contractions at 1945, 2035, 2200. Pt states there is no change in frequency or strength of contractions since she has been here.

## 2014-04-16 NOTE — MAU Note (Signed)
Pt states intercourse yesterday, noticed lof after that. Has had cramping/contractions x3 days. [facility] Denies bleeding or any other abnormal vaginal discharge prior to yesterday. Fluid is clear.

## 2014-04-16 NOTE — MAU Note (Signed)
Pt presents to MAU with complaints of having lower abdominal cramping and possible ROM. She states that she has been leaking fluid since last night

## 2014-04-17 NOTE — Progress Notes (Signed)
Dr. Clearance CootsHarper at bedside to evaluate patient

## 2014-04-17 NOTE — Progress Notes (Signed)
Dr. Clearance CootsHarper at bedside discussing risks of induction with patient. Pt verbalizes understanding and states she has no questions at this time.

## 2014-04-17 NOTE — Progress Notes (Signed)
Phone call from Dr Clearance CootsHarper to discuss POC for patient - expectant management of patient with IOL 72hrs after Elimination of Pregnancy Consent (Person) - VS per rountine, doppler FHR daily -CHayes, RN C 04/17/14

## 2014-04-17 NOTE — Progress Notes (Signed)
Patient ID: Bailey Hicks, female   DOB: [birth year], [age] yVladimir Faster.o.   MRN: [id number] Hospital Day: 2  S: Preterm labor symptoms: fluid leakage  O: Blood pressure 106/58, pulse 95, temperature 98.1 F (36.7 C), temperature source Tympanic, resp. rate 20, height 5\' 4"  (1.626 m), weight 199 lb (90.266 kg), last menstrual period 12/14/2013, SpO2 100 %.   WUJ:WJXBJYNWFHT:Baseline: 145 bpm Toco: None GNF:AOZHYQMVSVE:Dilation: 2 Effacement (%): 80 Exam by:: JRasch, NP  A/P- 22 y.o. admitted with:  Mucous - like vaginal discharge for R/O PPROM.  On speculum exam cervix was dilated with membranes seen bulging in os.  Patient admitted and ultrasound done which revealed normal amniotic fluid volume.  Patient had no uterine activity overnight.  This morning complained of large gush of fluid and on exam had gross ROM with fluid leaking from vagina into bed saturating a towel.  MFM had discussed options with patient and she elected medical termination of pregnancy.  Procedure explained to patient along with risks, complications, including hemorrhage and infection; and also alternative options of expectant management.  All questions were answered.  Patient elected to proceed with medical termination of pregnancy and the consent form was signed.    Present on Admission:  . Cervical incompetence during pregnancy  Pregnancy Complications: cervical insufficiency  Preterm labor management: bedrest advised Dating:  4566w0d PNL Needed:  none FWB:  stable PTL:  none ROD: induced vaginal

## 2014-04-17 NOTE — Progress Notes (Signed)
Pt called to desk stating she had questions, Dr. Clearance CootsHarper called and updated, and phone call was transferred into room and he spoke with pt. Will transfer to room 161.

## 2014-04-17 NOTE — Progress Notes (Signed)
   04/17/14 1500  Clinical Encounter Type  Visited With Patient  Visit Type Follow-up  Referral From Nurse (Safety Rounds)   Followed up to offer additional support; pt does not desire support at this time, but expressed appreciation for visit/availability.  Please page as needs arise:  [phone].  Thank you.  [address]Chaplain Kahlan Engebretson [place]unde[place], [place]MD[place] [phone]

## 2014-04-17 NOTE — Progress Notes (Addendum)
Pt called to desk stating, "I think my water just broke." RN in to evaluate pt, and noted large gush of creamy- clear fluid, and pt stating she feels that she is peeing on herself. Dr. Clearance CootsHarper called and given update, and stated he would be up to evaluate pt. Pt not complaining of contractions at this time.

## 2014-04-18 LAB — CULTURE, OB URINE: Special Requests: NORMAL

## 2014-04-18 MED ORDER — SODIUM CHLORIDE 0.9 % IV SOLN
2.0000 g | Freq: Four times a day (QID) | INTRAVENOUS | Status: DC
  Administered 2014-04-18 – 2014-04-20 (×8): 2 g via INTRAVENOUS
  Filled 2014-04-18 (×12): qty 2000

## 2014-04-18 MED ORDER — AZITHROMYCIN 500 MG PO TABS
500.0000 mg | ORAL_TABLET | Freq: Every day | ORAL | Status: DC
  Administered 2014-04-18 – 2014-04-19 (×2): 500 mg via ORAL
  Filled 2014-04-18 (×4): qty 1

## 2014-04-18 NOTE — Progress Notes (Signed)
Patient ID: Bailey Hicks, female   DOB: Jul 15, 1990, 23 y.o.   MRN: [id number] Hospital Day: 3  S: Preterm labor symptoms: fluid leakage and PPROM.  O: Blood pressure 108/50, pulse 79, temperature 98.2 F (36.8 C), temperature source Oral, resp. rate 16, height 5\' 4"  (1.626 m), weight 199 lb (90.266 kg), last menstrual period 12/14/2013, SpO2 100 %.   MVH:QIONGEXBFHT:Baseline: 150 bpm Toco: None MWU:[id number]:Dilation: 2 Effacement (%): 80 Exam by:: JRasch, NP  A/P- 22 y.o. admitted with:  Copious mucous discharge and cervical changes.  PPROM yesterday morning.  No UC's or bleeding.  Patient signed consent for elective medical termination of pregnancy yesterday at ~ 0900.  [place]14 we are awaiting 72 hour required waiting period before procedure can begin.  Present on Admission:  . Cervical incompetence during pregnancy  Pregnancy Complications: Cervical Insufficiency  Preterm labor management: bedrest advised Dating:  1468w1d PNL Needed:  none FWB:  good PTL:  none ROD: induced vaginal

## 2014-04-19 ENCOUNTER — Ambulatory Visit (HOSPITAL_COMMUNITY): Admission: RE | Admit: 2014-04-19 | Payer: Medicaid Other | Source: Ambulatory Visit

## 2014-04-19 LAB — CBC
HCT: 35.3 % — ABNORMAL LOW (ref 36.0–46.0)
HEMOGLOBIN: 12.1 g/dL (ref 12.0–15.0)
MCH: 30.3 pg (ref 26.0–34.0)
MCHC: 34.3 g/dL (ref 30.0–36.0)
MCV: 88.3 fL (ref 78.0–100.0)
Platelets: 227 10*3/uL (ref 150–400)
RBC: 4 MIL/uL (ref 3.87–5.11)
RDW: 13.3 % (ref 11.5–15.5)
WBC: 11.8 10*3/uL — ABNORMAL HIGH (ref 4.0–10.5)

## 2014-04-19 LAB — TYPE AND SCREEN
ABO/RH(D): A POS
ANTIBODY SCREEN: NEGATIVE

## 2014-04-19 MED ORDER — BUTORPHANOL TARTRATE 1 MG/ML IJ SOLN
1.0000 mg | INTRAMUSCULAR | Status: DC | PRN
  Administered 2014-04-20 (×8): 1 mg via INTRAVENOUS
  Filled 2014-04-19 (×8): qty 1

## 2014-04-19 MED ORDER — LACTATED RINGERS IV SOLN
INTRAVENOUS | Status: DC
  Administered 2014-04-19 – 2014-04-20 (×3): via INTRAVENOUS

## 2014-04-19 NOTE — Progress Notes (Signed)
Pt sad.  Emotional support given.  Talked about induction on Friday.  Answered questions for her.  Discussed restarting her IV per protocol. Will let this RN know when she is ready to do that.

## 2014-04-19 NOTE — Progress Notes (Signed)
Patient ID: Bailey Hicks, female   DOB: Dec 01, 1990, 23 y.o.   MRN: [id number] Hospital Day: 4  S: Preterm labor symptoms: fluid leakage and cervical insufficiency.  O: Blood pressure 100/46, pulse 90, temperature 98.5 F (36.9 C), temperature source Oral, resp. rate 16, height 5\' 4"  (1.626 m), weight 199 lb (90.266 kg), last menstrual period 12/14/2013, SpO2 100 %.   WUJ:WJXBJYNWFHT:Baseline: 150 bpm Toco: None GNF:AOZHYQMVSVE:Dilation: 2 Effacement (%): 80 Exam by:: JRasch, NP  . A/P- 23 y.o. admitted with:  Cervical insufficiency with copious vaginal discharge.  Progressed to PPROM.  No spontaneous PTL.  Stable.  Expectant management for now.  Patient consented for medical termination of pregnancy, which is scheduled to start 04-20-14.   Present on Admission:  . Cervical incompetence during pregnancy  Pregnancy Complications: PPROM  Preterm labor management: bedrest advised Dating:  8370w2d PNL Needed:  none FWB:  good PTL:  none ROD: induced vaginal

## 2014-04-19 NOTE — Progress Notes (Signed)
Restarted IV without problem.  Bloodwork drawn.

## 2014-04-20 ENCOUNTER — Encounter (HOSPITAL_COMMUNITY): Payer: Self-pay | Admitting: *Deleted

## 2014-04-20 DIAGNOSIS — IMO0001 Reserved for inherently not codable concepts without codable children: Secondary | ICD-10-CM

## 2014-04-20 DIAGNOSIS — O4290 Premature rupture of membranes, unspecified as to length of time between rupture and onset of labor, unspecified weeks of gestation: Secondary | ICD-10-CM

## 2014-04-20 DIAGNOSIS — O09212 Supervision of pregnancy with history of pre-term labor, second trimester: Secondary | ICD-10-CM

## 2014-04-20 MED ORDER — MISOPROSTOL 100 MCG PO TABS
300.0000 ug | ORAL_TABLET | Freq: Once | ORAL | Status: AC
  Administered 2014-04-20: 300 ug via VAGINAL
  Filled 2014-04-20: qty 3

## 2014-04-20 MED ORDER — OXYTOCIN 40 UNITS IN LACTATED RINGERS INFUSION - SIMPLE MED
INTRAVENOUS | Status: AC
  Filled 2014-04-20: qty 1000

## 2014-04-20 MED ORDER — OXYTOCIN 40 UNITS IN LACTATED RINGERS INFUSION - SIMPLE MED
INTRAVENOUS | Status: AC
  Administered 2014-04-20: 40 [IU]
  Filled 2014-04-20: qty 1000

## 2014-04-20 MED ORDER — OXYTOCIN 10 UNIT/ML IJ SOLN
40.0000 [IU] | INTRAVENOUS | Status: DC
  Filled 2014-04-20: qty 4

## 2014-04-20 NOTE — Progress Notes (Signed)
Chaplain at bedside

## 2014-04-20 NOTE — Progress Notes (Signed)
Chaplain notified of delivery.  Pt and FOB desire for the hospital to take care of the remains.  Will D/C home from L+D.

## 2014-04-20 NOTE — Progress Notes (Signed)
Reviewed D/C instructions with pt and family.  Answered questions for pt and family.  Make appt with Dr Jean RosenthalJackson Moore's office in 2 weeks.

## 2014-04-20 NOTE — Discharge Instructions (Signed)
Prostaglandin-Induced Abortion, Care After Refer to this sheet in the next few weeks. These instructions provide you with information on caring for yourself after your procedure. Your health care provider may also give you more specific instructions. Your treatment has been planned according to current medical practices, but problems sometimes occur. Call your health care provider if you have any problems or questions after your procedure. WHAT TO EXPECT AFTER THE PROCEDURE  You may have cramps for a few days. They may feel like menstrual cramps.  You may bleed for a few hours or a few days. It may feel like you are having a heavy period.  You may have a headache, diarrhea, or chills for 1-2 days.  You may feel tired and depressed.  Your next menstrual period will most likely start 4-6 weeks after the procedure, unless your health care provider has started you on birth control pills. HOME CARE INSTRUCTIONS   Only take over-the-counter or prescription medicines as directed by your health care provider.  Only take the medicines your health care provider recommends. Do not take aspirin. It can cause bleeding.  Keep track of how many menstrual pads you use each day and how soaked they are. Write down this information.  Rest and avoid strenuous activity for 2-3 weeks.  Do not have sexual intercourse for 2-3 weeks or until your health care provider says it is okay.  Do not douche or use tampons.  Ask your health care provider when you can start using birth control (contraception).  Keep all your follow-up appointments. SEEK MEDICAL CARE IF:  You have chills or fever.  You need to change your pad more often than every 2-4 hours.  You continue to have pain.  You have vaginal drainage.  You have pain or bleeding that gets worse. SEEK IMMEDIATE MEDICAL CARE IF:   You have very bad cramps in your stomach, back, or abdomen.  You have a fever.  There are large blood clots or tissue  coming out of your vagina. Save any tissue for your health care provider to inspect.  You need to change your pad every hour or more than once in an hour.  You become light-headed, weak, or faint. MAKE SURE YOU:  Understand these instructions.  Will watch your condition.  Will get help right away if you are not doing well or get worse. Document Released: 06/06/2013 Document Reviewed: 06/06/2013 [facility] Patient Information 2015 [place]. This information is not intended to replace advice given to you by your health care provider. Make sure you discuss any questions you have with your health care provider.

## 2014-04-20 NOTE — Discharge Summary (Signed)
  Obstetric Discharge Summary Reason for Admission: Threatened abortion Prenatal Procedures: none Intrapartum Procedures: spontaneous vaginal delivery Postpartum Procedures: none Complications-Operative and Postpartum: none  HEMOGLOBIN  Date Value Ref Range Status  04/19/2014 12.1 12.0 - 15.0 g/dL Final   HCT  Date Value Ref Range Status  04/19/2014 35.3* 36.0 - 46.0 % Final    Physical Exam:  General: alert Lochia: appropriate Uterine: firm Incision: n/Hicks DVT Evaluation: No evidence of DVT seen on physical exam.  Discharge Diagnoses: Active Problems:   Cervical incompetence during pregnancy   Premature rupture of membranes in pregnancy   [redacted] weeks gestation of pregnancy   Induced abortion   Discharge Information: Date: 04/20/2014 Activity: pelvic rest Diet: routine Medications:  Prior to Admission medications   Medication Sig Start Date End Date Taking? Authorizing Provider  Prenatal Vit-Fe Fumarate-FA (PRENATAL MULTIVITAMIN) TABS tablet Take 1 tablet by mouth daily at 12 noon.    Historical Provider, MD    Condition: stable Instructions: refer to routine discharge instructions Discharge to: home Follow-up Information    Follow up with HARPER,CHARLES A, MD. Schedule an appointment as soon as possible for Hicks visit in 2 weeks.   Specialty:  Obstetrics and Gynecology   Contact information:   [address] [place] [address] [phone]       Newborn Data:  Live born female  Birth Weight: 4.8 oz (136 g) APGAR: 0, 0   Home with mother.  Bailey Hicks,Bailey Hicks 04/20/2014, 11:22 AM

## 2014-04-20 NOTE — Progress Notes (Signed)
Placenta delivered intact in 3 pieces.  Dr Tamela OddiJackson Moore and Dr Gaynell FaceMarshall notified.

## 2014-04-20 NOTE — Progress Notes (Signed)
Dr Tamela OddiJackson Moore at bedside.    POC for  D/C home in 4 hours from L+D as long as VSS/bleeding WNL.  Make appt with Dr Jean RosenthalJackson Moore's office in 2 weeks.

## 2014-04-20 NOTE — Progress Notes (Signed)
I saw Bailey Hicks and Bailey Hicks as well as their family two times today to offer support.  They reported a need for rest and quiet.  They are aware of on-going availability of chaplain support at any point.    [address]Chaplain Katy Roevillelaussen Pager, [phone] 2:36 PM    04/20/14 1400  Clinical Encounter Type  Visited With Patient  Visit Type Spiritual support  Referral From Nurse

## 2014-05-07 ENCOUNTER — Encounter: Payer: Self-pay | Admitting: *Deleted

## 2014-05-14 ENCOUNTER — Encounter: Payer: PRIVATE HEALTH INSURANCE | Admitting: Obstetrics

## 2014-05-16 ENCOUNTER — Ambulatory Visit: Payer: PRIVATE HEALTH INSURANCE | Admitting: Obstetrics & Gynecology

## 2014-05-18 ENCOUNTER — Ambulatory Visit (INDEPENDENT_AMBULATORY_CARE_PROVIDER_SITE_OTHER): Payer: PRIVATE HEALTH INSURANCE | Admitting: Obstetrics & Gynecology

## 2014-05-18 ENCOUNTER — Encounter: Payer: Self-pay | Admitting: Obstetrics & Gynecology

## 2014-05-18 VITALS — BP 133/78 | HR 88 | Temp 98.8°F | Ht 64.0 in | Wt 205.0 lb

## 2014-05-18 DIAGNOSIS — Z30011 Encounter for initial prescription of contraceptive pills: Secondary | ICD-10-CM

## 2014-05-18 MED ORDER — NORETHIN ACE-ETH ESTRAD-FE 1-20 MG-MCG(24) PO TABS: 1.0000 | ORAL_TABLET | Freq: Every day | ORAL | Status: DC

## 2014-05-20 NOTE — Progress Notes (Signed)
Patient ID: Bailey FasterAlicia Ploch, female   DOB: 03-Nov-1990, 23 y.o.   MRN: [id number]  Chief Complaint  Patient presents with  . Follow-up    SAB    HPI Bailey Fasterlicia Jarrard is a 23 y.o. female.  No complaints.  HPI  History reviewed. No pertinent past medical history.  Past Surgical History  Procedure Laterality Date  . Wisdom tooth extraction    . Anterior cruciate ligament repair  Left knee    History reviewed. No pertinent family history.  Social History History  Substance Use Topics  . Smoking status: Never Smoker   . Smokeless tobacco: Never Used  . Alcohol Use: No    No Known Allergies  Current Outpatient Prescriptions  Medication Sig Dispense Refill  . Norethindrone Acetate-Ethinyl Estrad-FE (LOESTRIN 24 FE) 1-20 MG-MCG(24) tablet Take 1 tablet by mouth daily. 1 Package 11  . Prenatal Vit-Fe Fumarate-FA (PRENATAL MULTIVITAMIN) TABS tablet Take 1 tablet by mouth daily at 12 noon.     No current facility-administered medications for this visit.    Review of Systems Review of Systems Constitutional: negative for fatigue and weight loss Respiratory: negative for cough and wheezing Cardiovascular: negative for chest pain, fatigue and palpitations Gastrointestinal: negative for abdominal pain and change in bowel habits Genitourinary:negative for abnormal vaginal discharge Integument/breast: negative for nipple discharge Musculoskeletal:negative for myalgias Neurological: negative for gait problems and tremors Behavioral/Psych: negative for abusive relationship, depression Endocrine: negative for temperature intolerance     Blood pressure 133/78, pulse 88, temperature 98.8 F (37.1 C), height 5\' 4"  (1.626 m), weight 92.987 kg (205 lb).  Physical Exam Physical Exam General:   alert  Skin:   no rash or abnormalities  Lungs:   clear to auscultation bilaterally  Heart:   regular rate and rhythm, S1, S2 normal, no murmur, click, rub or gallop  Abdomen:  normal findings:  no organomegaly, soft, non-tender and no hernia  Pelvis:  External genitalia: normal general appearance Urinary system: urethral meatus normal and bladder without fullness, nontender Vaginal: normal without tenderness, induration or masses Cervix: normal appearance Adnexa: normal bimanual exam Uterus: anteverted and non-tender, normal size      Data Reviewed None  Assessment    S/P cytotec induction for a second trimester IUFD   ?flat affect  Plan    Orders Placed This Encounter  Procedures  . POCT urine pregnancy   Meds ordered this encounter  Medications  . Norethindrone Acetate-Ethinyl Estrad-FE (LOESTRIN 24 FE) 1-20 MG-MCG(24) tablet    Sig: Take 1 tablet by mouth daily.    Dispense:  1 Package    Refill:  11    Possible management options include: LARC Follow up as needed.         JACKSON-MOORE,Yassen [place] A 05/20/2014, 12:08 PM

## 2014-05-20 NOTE — Patient Instructions (Signed)
Contraception Choices Contraception (birth control) is the use of any methods or devices to prevent pregnancy. Below are some methods to help avoid pregnancy. HORMONAL METHODS   Contraceptive implant. This is a thin, plastic tube containing progesterone hormone. It does not contain estrogen hormone. Your health care provider inserts the tube in the inner part of the upper arm. The tube can remain in place for up to 3 years. After 3 years, the implant must be removed. The implant prevents the ovaries from releasing an egg (ovulation), thickens the cervical mucus to prevent sperm from entering the uterus, and thins the lining of the inside of the uterus.  Progesterone-only injections. These injections are given every 3 months by your health care provider to prevent pregnancy. This synthetic progesterone hormone stops the ovaries from releasing eggs. It also thickens cervical mucus and changes the uterine lining. This makes it harder for sperm to survive in the uterus.  Birth control pills. These pills contain estrogen and progesterone hormone. They work by preventing the ovaries from releasing eggs (ovulation). They also cause the cervical mucus to thicken, preventing the sperm from entering the uterus. Birth control pills are prescribed by a health care provider.Birth control pills can also be used to treat heavy periods.  Minipill. This type of birth control pill contains only the progesterone hormone. They are taken every day of each month and must be prescribed by your health care provider.  Birth control patch. The patch contains hormones similar to those in birth control pills. It must be changed once a week and is prescribed by a health care provider.  Vaginal ring. The ring contains hormones similar to those in birth control pills. It is left in the vagina for 3 weeks, removed for 1 week, and then a new one is put back in place. The patient must be comfortable inserting and removing the ring  from the vagina.A health care provider's prescription is necessary.  Emergency contraception. Emergency contraceptives prevent pregnancy after unprotected sexual intercourse. This pill can be taken right after sex or up to 5 days after unprotected sex. It is most effective the sooner you take the pills after having sexual intercourse. Most emergency contraceptive pills are available without a prescription. Check with your pharmacist. Do not use emergency contraception as your only form of birth control. BARRIER METHODS   Female condom. This is a thin sheath (latex or rubber) that is worn over the penis during sexual intercourse. It can be used with spermicide to increase effectiveness.  Female condom. This is a soft, loose-fitting sheath that is put into the vagina before sexual intercourse.  Diaphragm. This is a soft, latex, dome-shaped barrier that must be fitted by a health care provider. It is inserted into the vagina, along with a spermicidal jelly. It is inserted before intercourse. The diaphragm should be left in the vagina for 6 to 8 hours after intercourse.  Cervical cap. This is a round, soft, latex or plastic cup that fits over the cervix and must be fitted by a health care provider. The cap can be left in place for up to 48 hours after intercourse.  Sponge. This is a soft, circular piece of polyurethane foam. The sponge has spermicide in it. It is inserted into the vagina after wetting it and before sexual intercourse.  Spermicides. These are chemicals that kill or block sperm from entering the cervix and uterus. They come in the form of creams, jellies, suppositories, foam, or tablets. They do not require a   prescription. They are inserted into the vagina with an applicator before having sexual intercourse. The process must be repeated every time you have sexual intercourse. INTRAUTERINE CONTRACEPTION  Intrauterine device (IUD). This is a T-shaped device that is put in a woman's uterus  during a menstrual period to prevent pregnancy. There are 2 types:  Copper IUD. This type of IUD is wrapped in copper wire and is placed inside the uterus. Copper makes the uterus and fallopian tubes produce a fluid that kills sperm. It can stay in place for 10 years.  Hormone IUD. This type of IUD contains the hormone progestin (synthetic progesterone). The hormone thickens the cervical mucus and prevents sperm from entering the uterus, and it also thins the uterine lining to prevent implantation of a fertilized egg. The hormone can weaken or kill the sperm that get into the uterus. It can stay in place for 3-5 years, depending on which type of IUD is used. PERMANENT METHODS OF CONTRACEPTION  Female tubal ligation. This is when the woman's fallopian tubes are surgically sealed, tied, or blocked to prevent the egg from traveling to the uterus.  Hysteroscopic sterilization. This involves placing a small coil or insert into each fallopian tube. Your doctor uses a technique called hysteroscopy to do the procedure. The device causes scar tissue to form. This results in permanent blockage of the fallopian tubes, so the sperm cannot fertilize the egg. It takes about 3 months after the procedure for the tubes to become blocked. You must use another form of birth control for these 3 months.  Female sterilization. This is when the female has the tubes that carry sperm tied off (vasectomy).This blocks sperm from entering the vagina during sexual intercourse. After the procedure, the man can still ejaculate fluid (semen). NATURAL PLANNING METHODS  Natural family planning. This is not having sexual intercourse or using a barrier method (condom, diaphragm, cervical cap) on days the woman could become pregnant.  Calendar method. This is keeping track of the length of each menstrual cycle and identifying when you are fertile.  Ovulation method. This is avoiding sexual intercourse during ovulation.  Symptothermal  method. This is avoiding sexual intercourse during ovulation, using a thermometer and ovulation symptoms.  Post-ovulation method. This is timing sexual intercourse after you have ovulated. Regardless of which type or method of contraception you choose, it is important that you use condoms to protect against the transmission of sexually transmitted infections (STIs). Talk with your health care provider about which form of contraception is most appropriate for you. Document Released: 06/01/2005 Document Revised: 06/06/2013 Document Reviewed: 11/24/2012 [employer] Patient Information 2015 [employer]. This information is not intended to replace advice given to you by your health care provider. Make sure you discuss any questions you have with your health care provider.  

## 2014-06-11 ENCOUNTER — Encounter: Payer: Self-pay | Admitting: *Deleted

## 2014-06-12 ENCOUNTER — Encounter: Payer: Self-pay | Admitting: Obstetrics & Gynecology

## 2014-11-19 ENCOUNTER — Encounter (HOSPITAL_COMMUNITY): Payer: Self-pay | Admitting: *Deleted

## 2014-11-19 ENCOUNTER — Inpatient Hospital Stay (HOSPITAL_COMMUNITY)
Admission: AD | Admit: 2014-11-19 | Discharge: 2014-11-20 | Disposition: A | Discharge status: Home / Self Care | Payer: Medicaid Other | Source: Ambulatory Visit | Attending: Family Medicine | Admitting: Family Medicine

## 2014-11-19 ENCOUNTER — Inpatient Hospital Stay (HOSPITAL_COMMUNITY): Payer: Medicaid Other

## 2014-11-19 DIAGNOSIS — G8929 Other chronic pain: Secondary | ICD-10-CM

## 2014-11-19 DIAGNOSIS — N8320 Unspecified ovarian cysts: Secondary | ICD-10-CM | POA: Insufficient documentation

## 2014-11-19 DIAGNOSIS — R1031 Right lower quadrant pain: Secondary | ICD-10-CM

## 2014-11-19 DIAGNOSIS — R1032 Left lower quadrant pain: Secondary | ICD-10-CM

## 2014-11-19 DIAGNOSIS — R109 Unspecified abdominal pain: Secondary | ICD-10-CM | POA: Insufficient documentation

## 2014-11-19 LAB — URINALYSIS, ROUTINE W REFLEX MICROSCOPIC
Bilirubin Urine: NEGATIVE
Glucose, UA: NEGATIVE mg/dL
HGB URINE DIPSTICK: NEGATIVE
Ketones, ur: NEGATIVE mg/dL
Leukocytes, UA: NEGATIVE
Nitrite: NEGATIVE
Protein, ur: NEGATIVE mg/dL
Specific Gravity, Urine: 1.02 (ref 1.005–1.030)
UROBILINOGEN UA: 0.2 mg/dL (ref 0.0–1.0)
pH: 6.5 (ref 5.0–8.0)

## 2014-11-19 LAB — WET PREP, GENITAL
Clue Cells Wet Prep HPF POC: NONE SEEN
Trich, Wet Prep: NONE SEEN

## 2014-11-19 LAB — POCT PREGNANCY, URINE: PREG TEST UR: NEGATIVE

## 2014-11-19 NOTE — MAU Note (Signed)
Pt presents with complaint of lower abd and lower back pain and dysuria. Also reports urinary urgency.

## 2014-11-19 NOTE — MAU Provider Note (Signed)
  History   G1P1 not pregnant in with c/o lower abd pain for over one month. Pain is bilat and sharp in nature. Agrees to STD screening. Not on any birth control.  CSN: [id number]  Arrival date and time: 11/19/14 1958   None     Chief Complaint  Patient presents with  . Abdominal Pain   HPI  OB History    Gravida Para Term Preterm AB TAB SAB Ectopic Multiple Living   1 1       0 0      History reviewed. No pertinent past medical history.  Past Surgical History  Procedure Laterality Date  . Wisdom tooth extraction    . Anterior cruciate ligament repair  Left knee    No family history on file.  History  Substance Use Topics  . Smoking status: Never Smoker   . Smokeless tobacco: Never Used  . Alcohol Use: No    Allergies: No Known Allergies  Prescriptions prior to admission  Medication Sig Dispense Refill Last Dose  . Norethindrone Acetate-Ethinyl Estrad-FE (LOESTRIN 24 FE) 1-20 MG-MCG(24) tablet Take 1 tablet by mouth daily. 1 Package 11   . Prenatal Vit-Fe Fumarate-FA (PRENATAL MULTIVITAMIN) TABS tablet Take 1 tablet by mouth daily at 12 noon.   Not Taking    Review of Systems  Constitutional: Negative.   HENT: Negative.   Eyes: Negative.   Respiratory: Negative.   Cardiovascular: Negative.   Gastrointestinal: Positive for abdominal pain.  Genitourinary: Negative.   Musculoskeletal: Negative.   Skin: Negative.   Neurological: Negative.   Endo/Heme/Allergies: Negative.   Psychiatric/Behavioral: Negative.    Physical Exam   Blood pressure 135/76, pulse 80, temperature 98.8 F (37.1 C), temperature source Oral, resp. rate 16, height 5\' 4"  (1.626 m), weight 232 lb (105.235 kg), last menstrual period 11/01/2014, SpO2 100 %.  Physical Exam  Constitutional: She is oriented to person, place, and time. She appears well-developed and well-nourished.  HENT:  Head: Normocephalic.  Eyes: Pupils are equal, round, and reactive to light.  Neck: Normal range of  motion.  Cardiovascular: Normal rate, regular rhythm, normal heart sounds and intact distal pulses.   Respiratory: Effort normal and breath sounds normal.  GI: Soft. There is tenderness.  Tender bilat but mainly on left side.  Genitourinary: Vagina normal and uterus normal.  Musculoskeletal: Normal range of motion.  Neurological: She is alert and oriented to person, place, and time. She has normal reflexes.  Skin: Skin is warm and dry.  Psychiatric: She has a normal mood and affect. Her behavior is normal. Judgment and thought content normal.    MAU Course  Procedures  MDM Complex left ovarian cyst  Assessment and Plan  Refer to GYN clinic, STD screening done.  Wyvonnia DuskyLAWSON, Katilynn Sinkler DARLENE 11/19/2014, 9:49 PM

## 2014-11-19 NOTE — MAU Note (Signed)
Pt reports abdominal pain for 1 and 1/2 months

## 2014-11-19 NOTE — Discharge Instructions (Signed)
Ovarian Cyst An ovarian cyst is a fluid-filled sac that forms on an ovary. The ovaries are small organs that produce eggs in women. Various types of cysts can form on the ovaries. Most are not cancerous. Many do not cause problems, and they often go away on their own. Some may cause symptoms and require treatment. Common types of ovarian cysts include:  Functional cysts--These cysts may occur every month during the menstrual cycle. This is normal. The cysts usually go away with the next menstrual cycle if the woman does not get pregnant. Usually, there are no symptoms with a functional cyst.  Endometrioma cysts--These cysts form from the tissue that lines the uterus. They are also called "chocolate cysts" because they become filled with blood that turns brown. This type of cyst can cause pain in the lower abdomen during intercourse and with your menstrual period.  Cystadenoma cysts--This type develops from the cells on the outside of the ovary. These cysts can get very big and cause lower abdomen pain and pain with intercourse. This type of cyst can twist on itself, cut off its blood supply, and cause severe pain. It can also easily rupture and cause a lot of pain.  Dermoid cysts--This type of cyst is sometimes found in both ovaries. These cysts may contain different kinds of body tissue, such as skin, teeth, hair, or cartilage. They usually do not cause symptoms unless they get very big.  Theca lutein cysts--These cysts occur when too much of a certain hormone (human chorionic gonadotropin) is produced and overstimulates the ovaries to produce an egg. This is most common after procedures used to assist with the conception of a baby (in vitro fertilization). CAUSES   Fertility drugs can cause a condition in which multiple large cysts are formed on the ovaries. This is called ovarian hyperstimulation syndrome.  A condition called polycystic ovary syndrome can cause hormonal imbalances that can lead to  nonfunctional ovarian cysts. SIGNS AND SYMPTOMS  Many ovarian cysts do not cause symptoms. If symptoms are present, they may include:  Pelvic pain or pressure.  Pain in the lower abdomen.  Pain during sexual intercourse.  Increasing girth (swelling) of the abdomen.  Abnormal menstrual periods.  Increasing pain with menstrual periods.  Stopping having menstrual periods without being pregnant. DIAGNOSIS  These cysts are commonly found during a routine or annual pelvic exam. Tests may be ordered to find out more about the cyst. These tests may include:  Ultrasound.  X-ray of the pelvis.  CT scan.  MRI.  Blood tests. TREATMENT  Many ovarian cysts go away on their own without treatment. Your health care provider may want to check your cyst regularly for 2-3 months to see if it changes. For women in menopause, it is particularly important to monitor a cyst closely because of the higher rate of ovarian cancer in menopausal women. When treatment is needed, it may include any of the following:  A procedure to drain the cyst (aspiration). This may be done using a long needle and ultrasound. It can also be done through a laparoscopic procedure. This involves using a thin, lighted tube with a tiny camera on the end (laparoscope) inserted through a small incision.  Surgery to remove the whole cyst. This may be done using laparoscopic surgery or an open surgery involving a larger incision in the lower abdomen.  Hormone treatment or birth control pills. These methods are sometimes used to help dissolve a cyst. HOME CARE INSTRUCTIONS   Only take over-the-counter   or prescription medicines as directed by your health care provider.  Follow up with your health care provider as directed.  Get regular pelvic exams and Pap tests. SEEK MEDICAL CARE IF:   Your periods are late, irregular, or painful, or they stop.  Your pelvic pain or abdominal pain does not go away.  Your abdomen becomes  larger or swollen.  You have pressure on your bladder or trouble emptying your bladder completely.  You have pain during sexual intercourse.  You have feelings of fullness, pressure, or discomfort in your stomach.  You lose weight for no apparent reason.  You feel generally ill.  You become constipated.  You lose your appetite.  You develop acne.  You have an increase in body and facial hair.  You are gaining weight, without changing your exercise and eating habits.  You think you are pregnant. SEEK IMMEDIATE MEDICAL CARE IF:   You have increasing abdominal pain.  You feel sick to your stomach (nauseous), and you throw up (vomit).  You develop a fever that comes on suddenly.  You have abdominal pain during a bowel movement.  Your menstrual periods become heavier than usual. MAKE SURE YOU:  Understand these instructions.  Will watch your condition.  Will get help right away if you are not doing well or get worse. Document Released: 06/01/2005 Document Revised: 06/06/2013 Document Reviewed: 02/06/2013 [employer] Patient Information 2015 [employer]. This information is not intended to replace advice given to you by your health care provider. Make sure you discuss any questions you have with your health care provider.  

## 2014-11-20 LAB — GC/CHLAMYDIA PROBE AMP (~~LOC~~) NOT AT ARMC
Chlamydia: NEGATIVE
Neisseria Gonorrhea: NEGATIVE

## 2014-11-20 LAB — HIV ANTIBODY (ROUTINE TESTING W REFLEX): HIV Screen 4th Generation wRfx: NONREACTIVE

## 2014-11-20 LAB — RPR: RPR: NONREACTIVE

## 2014-11-26 ENCOUNTER — Other Ambulatory Visit (HOSPITAL_COMMUNITY): Payer: Self-pay | Admitting: Obstetrics and Gynecology

## 2014-11-26 DIAGNOSIS — N83209 Unspecified ovarian cyst, unspecified side: Secondary | ICD-10-CM

## 2014-11-26 MED ORDER — NORETHINDRONE ACET-ETHINYL EST 1.5-30 MG-MCG PO TABS: 1.0000 | ORAL_TABLET | Freq: Every day | ORAL | Status: DC

## 2014-11-28 ENCOUNTER — Encounter: Payer: Self-pay | Admitting: Obstetrics & Gynecology

## 2014-11-28 ENCOUNTER — Ambulatory Visit (INDEPENDENT_AMBULATORY_CARE_PROVIDER_SITE_OTHER): Payer: Self-pay | Admitting: Obstetrics & Gynecology

## 2014-11-28 VITALS — BP 128/72 | HR 72 | Temp 98.7°F | Ht 64.0 in | Wt 223.5 lb

## 2014-11-28 DIAGNOSIS — N83202 Unspecified ovarian cyst, left side: Secondary | ICD-10-CM

## 2014-11-28 DIAGNOSIS — N8320 Unspecified ovarian cysts: Secondary | ICD-10-CM

## 2014-11-29 NOTE — Patient Instructions (Signed)
Return to clinic for any scheduled appointments or for any gynecologic concerns as needed.   

## 2014-11-29 NOTE — Progress Notes (Signed)
CLINIC ENCOUNTER NOTE  History:  24 y.o. G1P1 here today for follow up for left ovarian cyst seen on recent ultrasound done for evaluation of pain during ED visit on 11/19/2014. She was started on OCPs.  No current pain reported She denies any abnormal vaginal discharge, bleeding, pelvic pain or other concerns.   History reviewed. No pertinent past medical history.  Past Surgical History  Procedure Laterality Date  . Wisdom tooth extraction    . Anterior cruciate ligament repair  Left knee   The following portions of the patient's history were reviewed and updated as appropriate: allergies, current medications, past family history, past medical history, past social history, past surgical history and problem list.   Health Maintenance:  Reports that she may have received Gardasil series  Review of Systems:  Pertinent items are noted in HPI. Comprehensive review of systems was otherwise negative.  Objective:  Physical Exam BP 128/72 mmHg  Pulse 72  Temp(Src) 98.7 F (37.1 C) (Oral)  Ht 5\' 4"  (1.626 m)  Wt 223 lb 8 oz (101.379 kg)  BMI 38.34 kg/m2  LMP 11/01/2014 CONSTITUTIONAL: Well-developed, well-nourished female in no acute distress.  HENT:  Normocephalic, atraumatic, External right and left ear normal. Oropharynx is clear and moist EYES: Conjunctivae and EOM are normal. Pupils are equal, round, and reactive to light. No scleral icterus.  NECK: Normal range of motion, supple, no masses SKIN: Skin is warm and dry. No rash noted. Not diaphoretic. No erythema. No pallor. NEUROLGIC: Alert and oriented to person, place, and time. Normal reflexes, muscle tone coordination. No cranial nerve deficit noted. PSYCHIATRIC: Normal mood and affect. Normal behavior. Normal judgment and thought content. CARDIOVASCULAR: Normal heart rate noted RESPIRATORY: Effort and breath sounds normal, no problems with respiration noted ABDOMEN: Soft, no distention noted.   PELVIC:  Deferred MUSCULOSKELETAL: Normal range of motion. No edema noted.  Labs and Imaging US Transvaginal Non-ob  11/19/2014   CLINICAL DATA:  Pelvic pain and dysuria  EXAM: TRANSABDOMINAL AND TRANSVAGINAL ULTRASOUND OF PELVIS  TECHNIQUE: Both transabdominal and transvaginal ultrasound examinations of the pelvis were performed. Transabdominal technique was performed for global imaging of the pelvis including uterus, ovaries, adnexal regions, and pelvic cul-de-sac. It was necessary to proceed with endovaginal exam following the transabdominal exam to visualize the ovaries.  COMPARISON:  None  FINDINGS: Uterus  Measurements: 7.5 x 3.6 x 4.8 cm. No fibroids or other mass visualized.  Endometrium  Thickness: 10.4 mm.  No focal abnormality visualized.  Right ovary  Measurements: 3.1 x 2.5 x 2.0 cm. Normal appearance/no adnexal mass.  Left ovary  Measurements: 5.1 x 3.3 x 4.4 cm. There is a 4.2 cm complex cyst in the left ovary.  Other findings  Small amount of free fluid is noted.  IMPRESSION: Complex left ovarian cyst is identified. This may simply represent a hemorrhagic cyst. Short-term followup is recommended to assess for stability or resolution.  Small amount of free pelvic fluid.   Electronically Signed   By: Alcide Clever M.D.   On: 11/19/2014 22:46   US Pelvis Complete  11/19/2014   CLINICAL DATA:  Pelvic pain and dysuria  EXAM: TRANSABDOMINAL AND TRANSVAGINAL ULTRASOUND OF PELVIS  TECHNIQUE: Both transabdominal and transvaginal ultrasound examinations of the pelvis were performed. Transabdominal technique was performed for global imaging of the pelvis including uterus, ovaries, adnexal regions, and pelvic cul-de-sac. It was necessary to proceed with endovaginal exam following the transabdominal exam to visualize the ovaries.  COMPARISON:  None  FINDINGS: Uterus  Measurements: 7.5 x 3.6 x 4.8 cm. No fibroids or other mass visualized.  Endometrium  Thickness: 10.4 mm.  No focal abnormality visualized.  Right ovary   Measurements: 3.1 x 2.5 x 2.0 cm. Normal appearance/no adnexal mass.  Left ovary  Measurements: 5.1 x 3.3 x 4.4 cm. There is a 4.2 cm complex cyst in the left ovary.  Other findings  Small amount of free fluid is noted.  IMPRESSION: Complex left ovarian cyst is identified. This may simply represent a hemorrhagic cyst. Short-term followup is recommended to assess for stability or resolution.  Small amount of free pelvic fluid.   Electronically Signed   By: Alcide Clever M.D.   On: 11/19/2014 22:46    Assessment & Plan:  Physiologic cyst, no current pain, on OCPs No future intervention needed Routine preventative health maintenance measures emphasized; patient will return for pap. Please refer to After Visit Summary for other counseling recommendations.     Jaynie Collins, MD, FACOG Attending Obstetrician & Gynecologist Center for [employer], [facility]

## 2015-04-24 ENCOUNTER — Encounter (HOSPITAL_COMMUNITY): Payer: Self-pay

## 2015-04-24 ENCOUNTER — Inpatient Hospital Stay (HOSPITAL_COMMUNITY)
Admission: AD | Admit: 2015-04-24 | Discharge: 2015-04-24 | Disposition: A | Discharge status: Home / Self Care | Payer: Self-pay | Source: Ambulatory Visit | Attending: Family Medicine | Admitting: Family Medicine

## 2015-04-24 DIAGNOSIS — M549 Dorsalgia, unspecified: Secondary | ICD-10-CM | POA: Insufficient documentation

## 2015-04-24 DIAGNOSIS — H6122 Impacted cerumen, left ear: Secondary | ICD-10-CM | POA: Insufficient documentation

## 2015-04-24 DIAGNOSIS — N939 Abnormal uterine and vaginal bleeding, unspecified: Secondary | ICD-10-CM | POA: Insufficient documentation

## 2015-04-24 LAB — URINALYSIS, ROUTINE W REFLEX MICROSCOPIC
Bilirubin Urine: NEGATIVE
GLUCOSE, UA: NEGATIVE mg/dL
KETONES UR: NEGATIVE mg/dL
LEUKOCYTES UA: NEGATIVE
Nitrite: NEGATIVE
PROTEIN: NEGATIVE mg/dL
Specific Gravity, Urine: >1.03 — ABNORMAL HIGH (ref 1.005–1.030)
Urobilinogen, UA: 0.2 mg/dL (ref 0.0–1.0)
pH: 5.5 (ref 5.0–8.0)

## 2015-04-24 LAB — POCT PREGNANCY, URINE: Preg Test, Ur: NEGATIVE

## 2015-04-24 LAB — URINE MICROSCOPIC-ADD ON

## 2015-04-24 NOTE — MAU Provider Note (Signed)
History     CSN: [id number]  Arrival date and time: 04/24/15 1132   First Provider Initiated Contact with Patient 04/24/15 1335      Chief Complaint  Patient presents with  . Otalgia  . Back Pain  . Abdominal Pain   HPI Bailey Hicks 23 y.o. G1P1 nonpregnant female presents with abdominal pain and ear pain.  These symptoms started a month ago and come and go. She did have ear pain earlier this morning but it is presently gone.  She sometimes has felt feverish and flu like but not today.  When she has checked her temperature, she has not had a fever.  She describes her abdominal pain as a gas-like feeling of fullness with sharp pains consistent with gas.  Her stomach hurts worse after eating but she has not noted particular dietary influences.  Her lymph nodes are tender.  She denies vaginal discharge, abnormal vaginal bleeding, weakness, dizziness, SOB, CP.  She stopped her birth control pill a month ago.  She was taking it for the ovarian cyst which was feeling better.   She reports she has an appt with [facility] next week.   OB History    Gravida Para Term Preterm AB TAB SAB Ectopic Multiple Living   1 1       0 0      History reviewed. No pertinent past medical history.  Past Surgical History  Procedure Laterality Date  . Wisdom tooth extraction    . Anterior cruciate ligament repair  Left knee    History reviewed. No pertinent family history.  Social History  Substance Use Topics  . Smoking status: Never Smoker   . Smokeless tobacco: Never Used  . Alcohol Use: No    Allergies: No Known Allergies  Prescriptions prior to admission  Medication Sig Dispense Refill Last Dose  . Norethindrone Acetate-Ethinyl Estradiol (JUNEL,LOESTRIN,MICROGESTIN) 1.5-30 MG-MCG tablet Take 1 tablet by mouth daily. 1 Package 11 Past Month at Unknown time    ROS Pertinent ROS in HPI.  All other systems are negative.   Physical Exam   Blood pressure 147/81, pulse 79, temperature 98.3 F  (36.8 C), resp. rate 18, height  (1.676 m), weight 257 lb (116.574 kg), last menstrual period 03/21/2015.  Physical Exam  Constitutional: She is oriented to person, place, and time. She appears well-developed and well-nourished. No distress.  HENT:  Head: Normocephalic and atraumatic.  Right Ear: External ear normal.  Left Ear: External ear normal.  Right ear with clear TM Left ear with cerumen impaction  Eyes: Conjunctivae and EOM are normal.  Neck: Normal range of motion. Neck supple.  Cardiovascular: Normal rate, regular rhythm and normal heart sounds.   Respiratory: Effort normal and breath sounds normal. No respiratory distress.  GI: Soft. Bowel sounds are normal. She exhibits no distension. There is no tenderness.  Musculoskeletal: Normal range of motion. She exhibits no edema.  Lymphadenopathy:    She has no cervical adenopathy.  Neurological: She is alert and oriented to person, place, and time.  Skin: Skin is warm and dry.  Psychiatric: She has a normal mood and affect. Her behavior is normal.   Pt declines pelvic exam/STD screening MAU Course  Procedures  MDM No active symptoms presently Cerumen impaction obvious on exam  Vital signs are stable  Assessment and Plan  A:  1. Cerumen impaction, left   2. Abnormal uterine bleeding (AUB)    P: Discharge to home Restart OCP already prescribed for control of  menstrual bleeding Debrox (OTC) for cerumen impaction Keep appt in [facility] for further eval of AUB/ovarian cyst.  Advise for PCP if ears continue to hurt Patient may return to MAU as needed or if her condition were to change or worsen   Bertram DenverKaren E Teague Clark 04/24/2015, 1:36 PM

## 2015-04-24 NOTE — MAU Note (Signed)
Pt presents to MAU with complaints of abdominal pain, earache, and back pain for a month. States she has not had a fever but feels clammy at times.

## 2015-04-24 NOTE — Progress Notes (Signed)
Patient states that she has been extremely gassy.

## 2015-04-24 NOTE — Discharge Instructions (Signed)
Use Debrox to help clear the wax  (available over the counter)  Cerumen Impaction The structures of the external ear canal secrete a waxy substance known as cerumen. Excess cerumen can build up in the ear canal, causing a condition known as cerumen impaction. Cerumen impaction can cause ear pain and disrupt the function of the ear. The rate of cerumen production differs for each individual. In certain individuals, the configuration of the ear canal may decrease his or her ability to naturally remove cerumen. CAUSES Cerumen impaction is caused by excessive cerumen production or buildup. RISK FACTORS  Frequent use of swabs to clean ears.  Having narrow ear canals.  Having eczema.  Being dehydrated. SIGNS AND SYMPTOMS  Diminished hearing.  Ear drainage.  Ear pain.  Ear itch. TREATMENT Treatment may involve:  Over-the-counter or prescription ear drops to soften the cerumen.  Removal of cerumen by a health care provider. This may be done with:  Irrigation with warm water. This is the most common method of removal.  Ear curettes and other instruments.  Surgery. This may be done in severe cases. HOME CARE INSTRUCTIONS  Take medicines only as directed by your health care provider.  Do not insert objects into the ear with the intent of cleaning the ear. PREVENTION  Do not insert objects into the ear, even with the intent of cleaning the ear. Removing cerumen as a part of normal hygiene is not necessary, and the use of swabs in the ear canal is not recommended.  Drink enough water to keep your urine clear or pale yellow.  Control your eczema if you have it. SEEK MEDICAL CARE IF:  You develop ear pain.  You develop bleeding from the ear.  The cerumen does not clear after you use ear drops as directed.   This information is not intended to replace advice given to you by your health care provider. Make sure you discuss any questions you have with your health care  provider.   Document Released: 07/09/2004 Document Revised: 06/22/2014 Document Reviewed: 01/16/2015 Elsevier Interactive Patient Education [employer]2016 Elsevier Inc.

## 2015-05-01 ENCOUNTER — Ambulatory Visit: Payer: Medicaid Other | Admitting: Obstetrics & Gynecology

## 2015-05-20 ENCOUNTER — Ambulatory Visit: Payer: Medicaid Other | Admitting: Obstetrics & Gynecology

## 2015-06-26 ENCOUNTER — Inpatient Hospital Stay (HOSPITAL_COMMUNITY)
Admission: AD | Admit: 2015-06-26 | Discharge: 2015-06-26 | Disposition: A | Discharge status: Home / Self Care | Payer: Self-pay | Source: Ambulatory Visit | Attending: Family Medicine | Admitting: Family Medicine

## 2015-06-26 ENCOUNTER — Encounter (HOSPITAL_COMMUNITY): Payer: Self-pay

## 2015-06-26 DIAGNOSIS — Z113 Encounter for screening for infections with a predominantly sexual mode of transmission: Secondary | ICD-10-CM

## 2015-06-26 DIAGNOSIS — R109 Unspecified abdominal pain: Secondary | ICD-10-CM | POA: Insufficient documentation

## 2015-06-26 LAB — URINALYSIS, ROUTINE W REFLEX MICROSCOPIC
Bilirubin Urine: NEGATIVE
Glucose, UA: NEGATIVE mg/dL
Hgb urine dipstick: NEGATIVE
Ketones, ur: NEGATIVE mg/dL
Leukocytes, UA: NEGATIVE
Nitrite: NEGATIVE
PROTEIN: NEGATIVE mg/dL
Specific Gravity, Urine: 1.025 (ref 1.005–1.030)
pH: 6 (ref 5.0–8.0)

## 2015-06-26 LAB — WET PREP, GENITAL
Clue Cells Wet Prep HPF POC: NONE SEEN
SPERM: NONE SEEN
Trich, Wet Prep: NONE SEEN
Yeast Wet Prep HPF POC: NONE SEEN

## 2015-06-26 NOTE — MAU Note (Signed)
Pain in lower abd, esp on lleft side.  Has had pain for about a month and a half, unchanged.  Hx of ovarian cyst, ? If that is what this is

## 2015-06-26 NOTE — Discharge Instructions (Signed)

## 2015-06-26 NOTE — MAU Provider Note (Signed)
  History     CSN: [id number]  Arrival date and time: 06/26/15 1113   First Provider Initiated Contact with Patient 06/26/15 1158      Chief Complaint  Patient presents with  . Abdominal Pain   HPI Bailey Hicks 25 y.o. G1P1 presents to MAU complaining of abdominal pain.  She was experiencing the pain last night but it is presently resolved.  It was 7/10, lasted 5-10 minutes.  It has occurred intermittently.  She previously had an ovarian cyst and that was more painful than the pain she experienced last night.  She denies fever, nausea, vomiting, weakness, vaginal bleeding, vaginal discharge, dysuria.  She has had one sexual partner in the last 60 days.   OB History    Gravida Para Term Preterm AB TAB SAB Ectopic Multiple Living   1 1       0 0      History reviewed. No pertinent past medical history.  Past Surgical History  Procedure Laterality Date  . Wisdom tooth extraction    . Anterior cruciate ligament repair  Left knee    History reviewed. No pertinent family history.  Social History  Substance Use Topics  . Smoking status: Never Smoker   . Smokeless tobacco: Never Used  . Alcohol Use: No    Allergies: No Known Allergies  Prescriptions prior to admission  Medication Sig Dispense Refill Last Dose  . Norethindrone Acetate-Ethinyl Estradiol (JUNEL,LOESTRIN,MICROGESTIN) 1.5-30 MG-MCG tablet Take 1 tablet by mouth daily. 1 Package 11 Past Week at Unknown time    ROS Pertinent ROS in HPI.  All other systems are negative.   Physical Exam   Blood pressure 133/81, pulse 88, temperature 98.2 F (36.8 C), temperature source Oral, resp. rate 18, height 5\' 5"  (1.651 m), weight 225 lb 6.4 oz (102.241 kg), last menstrual period 06/02/2015.  Physical Exam  Constitutional: She is oriented to person, place, and time. She appears well-developed and well-nourished. No distress.  HENT:  Head: Normocephalic and atraumatic.  Eyes: Conjunctivae and EOM are normal.  Neck:  Normal range of motion. Neck supple.  Cardiovascular: Normal rate and normal heart sounds.   Respiratory: Effort normal and breath sounds normal. No respiratory distress.  GI: Soft. She exhibits no distension.  Genitourinary:  Mod amt of homogenous discharge,  No CMT, no adnexal mass or tenderness  Musculoskeletal: Normal range of motion. She exhibits no edema.  Neurological: She is alert and oriented to person, place, and time.  Skin: Skin is warm and dry.  Psychiatric: She has a normal mood and affect. Her behavior is normal.    MAU Course  Procedures  MDM Wet prep, STD testing done.   No evidence for infection No active emergency as pain was last night  Assessment and Plan  A:  1. Abdominal pain in female    P: Discharge to home F/u in clinic as previously discussed Restart OCP Condoms for IC Patient may return to MAU as needed or if her condition were to change or worsen   Bertram DenverKaren E Teague Clark 06/26/2015, 12:44 PM

## 2015-06-26 NOTE — MAU Provider Note (Signed)
  History     CSN: [id number]  Arrival date and time: 06/26/15 1113   First Provider Initiated Contact with Patient 06/26/15 1158      Chief Complaint  Patient presents with  . Abdominal Pain   Abdominal Pain The current episode started more than 1 month ago. The onset quality is sudden. The problem occurs intermittently. Duration: 5-10 miuntes. The pain is located in the suprapubic region, LLQ and LUQ. The pain is at a severity of 7/10. The quality of the pain is sharp and a sensation of fullness. The abdominal pain does not radiate. Nothing aggravates the pain. The pain is relieved by nothing. She has tried nothing for the symptoms.    OB History    Gravida Para Term Preterm AB TAB SAB Ectopic Multiple Living   1 1       0 0      History reviewed. No pertinent past medical history.  Past Surgical History  Procedure Laterality Date  . Wisdom tooth extraction    . Anterior cruciate ligament repair  Left knee    History reviewed. No pertinent family history.  Social History  Substance Use Topics  . Smoking status: Never Smoker   . Smokeless tobacco: Never Used  . Alcohol Use: No    Allergies: No Known Allergies  Prescriptions prior to admission  Medication Sig Dispense Refill Last Dose  . Norethindrone Acetate-Ethinyl Estradiol (JUNEL,LOESTRIN,MICROGESTIN) 1.5-30 MG-MCG tablet Take 1 tablet by mouth daily. 1 Package 11 Past Week at Unknown time    ROS Physical Exam   Blood pressure 126/74, pulse 88, temperature 98.8 F (37.1 C), temperature source Oral, resp. rate 18, height 5\' 5"  (1.651 m), weight 102.241 kg (225 lb 6.4 oz), last menstrual period 06/02/2015.  Physical Exam  Constitutional: She is oriented to person, place, and time. She appears well-developed and well-nourished. No distress.  HENT:  Head: Normocephalic and atraumatic.  Neck: Normal range of motion.  Cardiovascular: Normal rate and regular rhythm.   Respiratory: Effort normal and breath sounds  normal. No respiratory distress.  GI: Soft. Bowel sounds are normal. There is tenderness in the right lower quadrant, suprapubic area and left lower quadrant.  Mildly tender across lower abdomen. Pain more prominent in suprapubic region   Musculoskeletal: Normal range of motion. She exhibits no edema.  Neurological: She is alert and oriented to person, place, and time.  Skin: Skin is warm and dry.  Psychiatric: She has a normal mood and affect. Her behavior is normal.    MAU Course  Procedures  MDM Vitals stable. Pt not currently experiencing the described pain. No infection identified on UA or wet prep. No significant pain on pelvic exam.  Assessment and Plan  A: abdominal pain in female.  P: discharge to home. Restart OCP. Follow up [facility]. Pt may return to MAU if symptoms worsen or pain continues.   Ivar DrapeAmanda Rayli Wiederhold, PA-S [place] 06/26/2015, 1:18 PM

## 2015-06-27 LAB — GC/CHLAMYDIA PROBE AMP (~~LOC~~) NOT AT ARMC
CHLAMYDIA, DNA PROBE: NEGATIVE
Neisseria Gonorrhea: NEGATIVE

## 2015-07-07 ENCOUNTER — Emergency Department (HOSPITAL_COMMUNITY): Payer: Self-pay

## 2015-07-07 ENCOUNTER — Emergency Department (HOSPITAL_COMMUNITY)
Admission: EM | Admit: 2015-07-07 | Discharge: 2015-07-08 | Disposition: A | Discharge status: Home / Self Care | Payer: Self-pay | Attending: Emergency Medicine | Admitting: Emergency Medicine

## 2015-07-07 ENCOUNTER — Encounter (HOSPITAL_COMMUNITY): Payer: Self-pay | Admitting: *Deleted

## 2015-07-07 DIAGNOSIS — R079 Chest pain, unspecified: Secondary | ICD-10-CM | POA: Insufficient documentation

## 2015-07-07 DIAGNOSIS — R0602 Shortness of breath: Secondary | ICD-10-CM | POA: Insufficient documentation

## 2015-07-07 DIAGNOSIS — R002 Palpitations: Secondary | ICD-10-CM | POA: Insufficient documentation

## 2015-07-07 DIAGNOSIS — Z3202 Encounter for pregnancy test, result negative: Secondary | ICD-10-CM | POA: Insufficient documentation

## 2015-07-07 LAB — BASIC METABOLIC PANEL
ANION GAP: 8 (ref 5–15)
BUN: 16 mg/dL (ref 6–20)
CALCIUM: 9.5 mg/dL (ref 8.9–10.3)
CO2: 27 mmol/L (ref 22–32)
Chloride: 106 mmol/L (ref 101–111)
Creatinine, Ser: 0.86 mg/dL (ref 0.44–1.00)
GFR calc Af Amer: >60 mL/min (ref >60)
GFR calc non Af Amer: >60 mL/min (ref >60)
Glucose, Bld: 101 mg/dL — ABNORMAL HIGH (ref 65–99)
POTASSIUM: 3.5 mmol/L (ref 3.5–5.1)
SODIUM: 141 mmol/L (ref 135–145)

## 2015-07-07 LAB — CBC
HEMATOCRIT: 37.9 % (ref 36.0–46.0)
Hemoglobin: 12.4 g/dL (ref 12.0–15.0)
MCH: 28.8 pg (ref 26.0–34.0)
MCHC: 32.7 g/dL (ref 30.0–36.0)
MCV: 88.1 fL (ref 78.0–100.0)
PLATELETS: 257 10*3/uL (ref 150–400)
RBC: 4.3 MIL/uL (ref 3.87–5.11)
RDW: 14 % (ref 11.5–15.5)
WBC: 8.9 10*3/uL (ref 4.0–10.5)

## 2015-07-07 LAB — TROPONIN I: Troponin I: <0.03 ng/mL (ref <0.031)

## 2015-07-07 NOTE — ED Provider Notes (Signed)
CSN: [id number]     Arrival date & time 07/07/15  2237 History   First MD Initiated Contact with Patient 07/07/15 2347     Chief Complaint  Patient presents with  . Chest Pain     (Consider location/radiation/quality/duration/timing/severity/associated sxs/prior Treatment) HPI 25 year old female who presents with chest pain and palpitations. She is otherwise healthy. States that while watching TV this evening she developed sudden onset of palpitations, shortness of breath, and chest pressure. States that she may have had one or 2 episodes of this in the past, that was short lasting. States that this lasted for about 30 minutes before self resolving, which worried her. States that she went on the Internet and was told that she may be having a heart attack. Came to the ED for evaluation. Denies leg swelling, leg pain, recent immobilization, prior history or family history of DVT/PE. Has not had any recent illnesses, but does state that she she was vomiting all day yesterday after drinking the night before. States that she has been able to tolerate by mouth here today it has been drinking fluids. Denies prior history of exertional chest pain, shortness of breath, syncope or near syncope. No recent caffeinated beverages.   History reviewed. No pertinent past medical history. Past Surgical History  Procedure Laterality Date  . Wisdom tooth extraction    . Anterior cruciate ligament repair  Left knee   History reviewed. No pertinent family history. Social History  Substance Use Topics  . Smoking status: Never Smoker   . Smokeless tobacco: Never Used  . Alcohol Use: No   OB History    Gravida Para Term Preterm AB TAB SAB Ectopic Multiple Living   1 1       0 0     Review of Systems 10/14 systems reviewed and are negative other than those stated in the HPI   Allergies  Review of patient's allergies indicates no known allergies.  Home Medications   Prior to Admission medications    Medication Sig Start Date End Date Taking? Authorizing Provider  naproxen sodium (ANAPROX) 220 MG tablet Take 400 mg by mouth 2 (two) times daily as needed (pain).   Yes Historical Provider, MD   BP 136/78 mmHg  Pulse 87  Temp(Src) 98.1 F (36.7 C)  Resp 20  Ht  (1.626 m)  Wt 220 lb (99.791 kg)  BMI 37.74 kg/m2  SpO2 99%  LMP 07/04/2015 Physical Exam Physical Exam  Nursing note and vitals reviewed. Constitutional: Well developed, well nourished, non-toxic, and in no acute distress Head: Normocephalic and atraumatic.  Mouth/Throat: Oropharynx is clear and moist.  Neck: Normal range of motion. Neck supple.  Cardiovascular: Normal rate and regular rhythm.   Pulmonary/Chest: Effort normal and breath sounds normal.  Abdominal: Soft. There is no tenderness. There is no rebound and no guarding.  Musculoskeletal: Normal range of motion.  Neurological: Alert, no facial droop, fluent speech, moves all extremities symmetrically Skin: Skin is warm and dry.  Psychiatric: Cooperative  ED Course  Procedures (including critical care time) Labs Review Labs Reviewed  BASIC METABOLIC PANEL - Abnormal; Notable for the following:    Glucose, Bld 101 (*)    All other components within normal limits  CBC  TROPONIN I  PREGNANCY, URINE    Imaging Review Dg Chest 2 View  07/07/2015  CLINICAL DATA:  Chest pressure, palpitations, and shortness of breath tonight. EXAM: CHEST  2 VIEW COMPARISON:  None. FINDINGS: The heart size and mediastinal contours are  within normal limits. Both lungs are clear. The visualized skeletal structures are unremarkable. IMPRESSION: No active cardiopulmonary disease. Electronically Signed   By: Burman Nieves M.D.   On: 07/07/2015 23:04   I have personally reviewed and evaluated these images and lab results as part of my medical decision-making.   EKG Interpretation   Date/Time:  Sunday July 07 2015 22:47:28 EST Ventricular Rate:  91 PR Interval:   138 QRS Duration: 78 QT Interval:  358 QTC Calculation: 440 R Axis:   80 Text Interpretation:  Normal sinus rhythm Nonspecific T wave abnormality  Abnormal ECG No prior EKg for comparison Confirmed by Heddy Vidana MD, Annabelle Harman ([id number])  on 07/07/2015 11:42:56 PM      MDM   Final diagnoses:  Chest pain, unspecified chest pain type  Palpitations   25 year old female who presents with atypical chest pain. Asymptomatic on arrival. VS stable. Exam unremarkable. EKG without stigmata of arrhythmia and shows no ischemic changes. Chest x-ray without acute cardiopulmonary processes. Basic blood work without major electrolyte or metabolic derangements, and troponin is negative. Do not suspect ACS given atypical history and age with lack of risk factors. Is PERC negative and no concern for PE. Recent drinking binge with vomiting may have played role in presentation today.   I do not suspect serious or life-threatening cause of her symptoms today. Strict return and follow-up instructions are reviewed. She expressed understanding of all discharge instructions and felt comfortable to plan of care.   Lavera Guise, MD 07/08/15 [phone]

## 2015-07-07 NOTE — ED Notes (Signed)
The pt is c/o chest pain ans sob today  No cold no cough.  She has had in the past. lmp yesterday

## 2015-07-08 LAB — PREGNANCY, URINE: PREG TEST UR: NEGATIVE

## 2015-07-08 NOTE — Discharge Instructions (Signed)
We did not find serious cause of your symptoms today. Return without fail for worsening symptoms, including passing out, worsening pain, difficulty breathing, or any other symptoms concerning to you.  Nonspecific Chest Pain  Chest pain can be caused by many different conditions. There is always a chance that your pain could be related to something serious, such as a heart attack or a blood clot in your lungs. Chest pain can also be caused by conditions that are not life-threatening. If you have chest pain, it is very important to follow up with your health care provider. CAUSES  Chest pain can be caused by:  Heartburn.  Pneumonia or bronchitis.  Anxiety or stress.  Inflammation around your heart (pericarditis) or lung (pleuritis or pleurisy).  A blood clot in your lung.  A collapsed lung (pneumothorax). It can develop suddenly on its own (spontaneous pneumothorax) or from trauma to the chest.  Shingles infection (varicella-zoster virus).  Heart attack.  Damage to the bones, muscles, and cartilage that make up your chest wall. This can include:  Bruised bones due to injury.  Strained muscles or cartilage due to frequent or repeated coughing or overwork.  Fracture to one or more ribs.  Sore cartilage due to inflammation (costochondritis). RISK FACTORS  Risk factors for chest pain may include:  Activities that increase your risk for trauma or injury to your chest.  Respiratory infections or conditions that cause frequent coughing.  Medical conditions or overeating that can cause heartburn.  Heart disease or family history of heart disease.  Conditions or health behaviors that increase your risk of developing a blood clot.  Having had chicken pox (varicella zoster). SIGNS AND SYMPTOMS Chest pain can feel like:  Burning or tingling on the surface of your chest or deep in your chest.  Crushing, pressure, aching, or squeezing pain.  Dull or sharp pain that is worse when  you move, cough, or take a deep breath.  Pain that is also felt in your back, neck, shoulder, or arm, or pain that spreads to any of these areas. Your chest pain may come and go, or it may stay constant. DIAGNOSIS Lab tests or other studies may be needed to find the cause of your pain. Your health care provider may have you take a test called an ambulatory ECG (electrocardiogram). An ECG records your heartbeat patterns at the time the test is performed. You may also have other tests, such as:  Transthoracic echocardiogram (TTE). During echocardiography, sound waves are used to create a picture of all of the heart structures and to look at how blood flows through your heart.  Transesophageal echocardiogram (TEE).This is a more advanced imaging test that obtains images from inside your body. It allows your health care provider to see your heart in finer detail.  Cardiac monitoring. This allows your health care provider to monitor your heart rate and rhythm in real time.  Holter monitor. This is a portable device that records your heartbeat and can help to diagnose abnormal heartbeats. It allows your health care provider to track your heart activity for several days, if needed.  Stress tests. These can be done through exercise or by taking medicine that makes your heart beat more quickly.  Blood tests.  Imaging tests. TREATMENT  Your treatment depends on what is causing your chest pain. Treatment may include:  Medicines. These may include:  Acid blockers for heartburn.  Anti-inflammatory medicine.  Pain medicine for inflammatory conditions.  Antibiotic medicine, if an infection is present.  Medicines to dissolve blood clots.  Medicines to treat coronary artery disease.  Supportive care for conditions that do not require medicines. This may include:  Resting.  Applying heat or cold packs to injured areas.  Limiting activities until pain decreases. HOME CARE INSTRUCTIONS  If  you were prescribed an antibiotic medicine, finish it all even if you start to feel better.  Avoid any activities that bring on chest pain.  Do not use any tobacco products, including cigarettes, chewing tobacco, or electronic cigarettes. If you need help quitting, ask your health care provider.  Do not drink alcohol.  Take medicines only as directed by your health care provider.  Keep all follow-up visits as directed by your health care provider. This is important. This includes any further testing if your chest pain does not go away.  If heartburn is the cause for your chest pain, you may be told to keep your head raised (elevated) while sleeping. This reduces the chance that acid will go from your stomach into your esophagus.  Make lifestyle changes as directed by your health care provider. These may include:  Getting regular exercise. Ask your health care provider to suggest some activities that are safe for you.  Eating a heart-healthy diet. A registered dietitian can help you to learn healthy eating options.  Maintaining a healthy weight.  Managing diabetes, if necessary.  Reducing stress. SEEK MEDICAL CARE IF:  Your chest pain does not go away after treatment.  You have a rash with blisters on your chest.  You have a fever. SEEK IMMEDIATE MEDICAL CARE IF:   Your chest pain is worse.  You have an increasing cough, or you cough up blood.  You have severe abdominal pain.  You have severe weakness.  You faint.  You have chills.  You have sudden, unexplained chest discomfort.  You have sudden, unexplained discomfort in your arms, back, neck, or jaw.  You have shortness of breath at any time.  You suddenly start to sweat, or your skin gets clammy.  You feel nauseous or you vomit.  You suddenly feel light-headed or dizzy.  Your heart begins to beat quickly, or it feels like it is skipping beats. These symptoms may represent a serious problem that is an  emergency. Do not wait to see if the symptoms will go away. Get medical help right away. Call your local emergency services (911 in the [place] Do not drive yourself to the hospital.   This information is not intended to replace advice given to you by your health care provider. Make sure you discuss any questions you have with your health care provider.   Document Released: 03/11/2005 Document Revised: 06/22/2014 Document Reviewed: 01/05/2014 Elsevier Interactive Patient Education [employer]

## 2015-07-08 NOTE — ED Notes (Signed)
Pt left with all her belongings and ambulated out of the treatment area.  

## 2015-07-23 ENCOUNTER — Encounter (HOSPITAL_BASED_OUTPATIENT_CLINIC_OR_DEPARTMENT_OTHER): Payer: Self-pay

## 2015-07-23 DIAGNOSIS — L0201 Cutaneous abscess of face: Secondary | ICD-10-CM | POA: Insufficient documentation

## 2015-07-23 NOTE — ED Notes (Signed)
C/o pimple to left cheek-pt "picking at it" x today-c/o clear, bloody d/c-NAD

## 2015-07-24 ENCOUNTER — Emergency Department (HOSPITAL_BASED_OUTPATIENT_CLINIC_OR_DEPARTMENT_OTHER)
Admission: EM | Admit: 2015-07-24 | Discharge: 2015-07-24 | Disposition: A | Discharge status: Home / Self Care | Payer: Self-pay | Attending: Emergency Medicine | Admitting: Emergency Medicine

## 2015-07-24 DIAGNOSIS — L0201 Cutaneous abscess of face: Secondary | ICD-10-CM

## 2015-07-24 MED ORDER — DOXYCYCLINE HYCLATE 100 MG PO TABS
100.0000 mg | ORAL_TABLET | Freq: Once | ORAL | Status: AC
  Administered 2015-07-24: 100 mg via ORAL
  Filled 2015-07-24: qty 1

## 2015-07-24 MED ORDER — DOXYCYCLINE HYCLATE 100 MG PO CAPS: 100.0000 mg | ORAL_CAPSULE | Freq: Two times a day (BID) | ORAL | Status: DC

## 2015-07-24 NOTE — ED Notes (Signed)
See paper charting 

## 2015-07-24 NOTE — Discharge Instructions (Signed)
Abscess An abscess is an infected area that contains a collection of pus and debris.It can occur in almost any part of the body. An abscess is also known as a furuncle or boil. CAUSES  An abscess occurs when tissue gets infected. This can occur from blockage of oil or sweat glands, infection of hair follicles, or a minor injury to the skin. As the body tries to fight the infection, pus collects in the area and creates pressure under the skin. This pressure causes pain. People with weakened immune systems have difficulty fighting infections and get certain abscesses more often.  SYMPTOMS Usually an abscess develops on the skin and becomes a painful mass that is red, warm, and tender. If the abscess forms under the skin, you may feel a moveable soft area under the skin. Some abscesses break open (rupture) on their own, but most will continue to get worse without care. The infection can spread deeper into the body and eventually into the bloodstream, causing you to feel ill.  DIAGNOSIS  Your caregiver will take your medical history and perform a physical exam. A sample of fluid may also be taken from the abscess to determine what is causing your infection. TREATMENT  Your caregiver may prescribe antibiotic medicines to fight the infection. However, taking antibiotics alone usually does not cure an abscess. Your caregiver may need to make a small cut (incision) in the abscess to drain the pus. In some cases, gauze is packed into the abscess to reduce pain and to continue draining the area. HOME CARE INSTRUCTIONS   Only take over-the-counter or prescription medicines for pain, discomfort, or fever as directed by your caregiver.  If you were prescribed antibiotics, take them as directed. Finish them even if you start to feel better.  If gauze is used, follow your caregiver's directions for changing the gauze.  To avoid spreading the infection:  Keep your draining abscess covered with a  bandage.  Wash your hands well.  Do not share personal care items, towels, or whirlpools with others.  Avoid skin contact with others.  Keep your skin and clothes clean around the abscess.  Keep all follow-up appointments as directed by your caregiver. SEEK MEDICAL CARE IF:   You have increased pain, swelling, redness, fluid drainage, or bleeding.  You have muscle aches, chills, or a general ill feeling.  You have a fever. MAKE SURE YOU:   Understand these instructions.  Will watch your condition.  Will get help right away if you are not doing well or get worse.   This information is not intended to replace advice given to you by your health care provider. Make sure you discuss any questions you have with your health care provider.   Document Released: 03/11/2005 Document Revised: 12/01/2011 Document Reviewed: 08/14/2011 Elsevier Interactive Patient Education 2016 [employer].  Doxycycline tablets or capsules What is this medicine? DOXYCYCLINE (dox i SYE kleen) is a tetracycline antibiotic. It kills certain bacteria or stops their growth. It is used to treat many kinds of infections, like dental, skin, respiratory, and urinary tract infections. It also treats acne, Lyme disease, malaria, and certain sexually transmitted infections. This medicine may be used for other purposes; ask your health care provider or pharmacist if you have questions. What should I tell my health care provider before I take this medicine? They need to know if you have any of these conditions: -liver disease -long exposure to sunlight like working outdoors -stomach problems like colitis -an unusual or allergic  reaction to doxycycline, tetracycline antibiotics, other medicines, foods, dyes, or preservatives -pregnant or trying to get pregnant -breast-feeding How should I use this medicine? Take this medicine by mouth with a full glass of water. Follow the directions on the prescription label. It is  best to take this medicine without food, but if it upsets your stomach take it with food. Take your medicine at regular intervals. Do not take your medicine more often than directed. Take all of your medicine as directed even if you think you are better. Do not skip doses or stop your medicine early. Talk to your pediatrician regarding the use of this medicine in children. While this drug may be prescribed for selected conditions, precautions do apply. Overdosage: If you think you have taken too much of this medicine contact a poison control center or emergency room at once. NOTE: This medicine is only for you. Do not share this medicine with others. What if I miss a dose? If you miss a dose, take it as soon as you can. If it is almost time for your next dose, take only that dose. Do not take double or extra doses. What may interact with this medicine? -antacids -barbiturates -birth control pills -bismuth subsalicylate -carbamazepine -methoxyflurane -other antibiotics -phenytoin -vitamins that contain iron -warfarin This list may not describe all possible interactions. Give your health care provider a list of all the medicines, herbs, non-prescription drugs, or dietary supplements you use. Also tell them if you smoke, drink alcohol, or use illegal drugs. Some items may interact with your medicine. What should I watch for while using this medicine? Tell your doctor or health care professional if your symptoms do not improve. Do not treat diarrhea with over the counter products. Contact your doctor if you have diarrhea that lasts more than 2 days or if it is severe and watery. Do not take this medicine just before going to bed. It may not dissolve properly when you lay down and can cause pain in your throat. Drink plenty of fluids while taking this medicine to also help reduce irritation in your throat. This medicine can make you more sensitive to the sun. Keep out of the sun. If you cannot avoid  being in the sun, wear protective clothing and use sunscreen. Do not use sun lamps or tanning beds/booths. Birth control pills may not work properly while you are taking this medicine. Talk to your doctor about using an extra method of birth control. If you are being treated for a sexually transmitted infection, avoid sexual contact until you have finished your treatment. Your sexual partner may also need treatment. Avoid antacids, aluminum, calcium, magnesium, and iron products for 4 hours before and 2 hours after taking a dose of this medicine. If you are using this medicine to prevent malaria, you should still protect yourself from contact with mosquitos. Stay in screened-in areas, use mosquito nets, keep your body covered, and use an insect repellent. What side effects may I notice from receiving this medicine? Side effects that you should report to your doctor or health care professional as soon as possible: -allergic reactions like skin rash, itching or hives, swelling of the face, lips, or tongue -difficulty breathing -fever -itching in the rectal or genital area -pain on swallowing -redness, blistering, peeling or loosening of the skin, including inside the mouth -severe stomach pain or cramps -unusual bleeding or bruising -unusually weak or tired -yellowing of the eyes or skin Side effects that usually do not require medical attention (  report to your doctor or health care professional if they continue or are bothersome): °-diarrhea °-loss of appetite °-nausea, vomiting °This list may not describe all possible side effects. Call your doctor for medical advice about side effects. You may report side effects to FDA at 1-800-FDA-1088. °Where should I keep my medicine? °Keep out of the reach of children. °Store at room temperature, below 30 degrees C (86 degrees F). Protect from light. Keep container tightly closed. Throw away any unused medicine after the expiration date. Taking this medicine  after the expiration date can make you seriously ill. °NOTE: This sheet is a summary. It may not cover all possible information. If you have questions about this medicine, talk to your doctor, pharmacist, or health care provider. °  °© 2016, Elsevier/[employer]. (2014-09-21 12:10:28) ° °

## 2015-07-24 NOTE — ED Provider Notes (Signed)
CSN: [id number]     Arrival date & time 07/23/15  2038 History   First MD Initiated Contact with Patient 07/24/15 0242     Chief Complaint  Patient presents with  . Acne     (Consider location/radiation/quality/duration/timing/severity/associated sxs/prior Treatment) The history is provided by the patient.   25 year old female noted a knot on the left cheek for about 4 weeks. She initially thought it was a pimple and tried to pop it but was not able to. Yesterday, she was able to pop it and expressed a small amount of material. Today, the area is swollen and she is noted a tender knot just in front of her left ear. She denies fever, chills, sweats. She denies pain.  History reviewed. No pertinent past medical history. Past Surgical History  Procedure Laterality Date  . Wisdom tooth extraction    . Anterior cruciate ligament repair  Left knee   No family history on file. Social History  Substance Use Topics  . Smoking status: Never Smoker   . Smokeless tobacco: Never Used  . Alcohol Use: No   OB History    Gravida Para Term Preterm AB TAB SAB Ectopic Multiple Living   1 1       0 0     Review of Systems  All other systems reviewed and are negative.     Allergies  Review of patient's allergies indicates no known allergies.  Home Medications   Prior to Admission medications   Not on File   BP 146/83 mmHg  Pulse 98  Temp(Src) 98.9 F (37.2 C) (Oral)  Resp 20  Ht  (1.626 m)  Wt 225 lb (102.059 kg)  BMI 38.60 kg/m2  SpO2 100%  LMP 07/04/2015 Physical Exam  Nursing note and vitals reviewed.  25 year old female, resting comfortably and in no acute distress. Vital signs are significant for hypertension. Oxygen saturation is 100%, which is normal. Head is normocephalic and atraumatic. PERRLA, EOMI. Oropharynx is clear. There is an area of swelling over the left malar area with a slight area of induration centrally. No definite fluctuance. There is a palpable left  preauricular lymph node. No erythema or warmth. Neck is nontender and supple without adenopathy or JVD. Back is nontender and there is no CVA tenderness. Lungs are clear without rales, wheezes, or rhonchi. Chest is nontender. Heart has regular rate and rhythm without murmur. Abdomen is soft, flat, nontender without masses or hepatosplenomegaly and peristalsis is normoactive. Extremities have no cyanosis or edema, full range of motion is present. Skin is warm and dry without rash. Neurologic: Mental status is normal, cranial nerves are intact, there are no motor or sensory deficits.  ED Course  Procedures (including critical care time)   MDM   Final diagnoses:  Abscess of external cheek, left    Facial abscess/cellulitis. No definite abscess cavity that can be drained currently. She is placed on doxycycline and is referred to ENT for follow-up. Old records are reviewed and she has no relevant past visits.    Dione Booze, MD 07/24/15 [phone]

## 2015-07-25 ENCOUNTER — Encounter: Payer: Self-pay | Admitting: Obstetrics & Gynecology

## 2015-07-25 ENCOUNTER — Ambulatory Visit (INDEPENDENT_AMBULATORY_CARE_PROVIDER_SITE_OTHER): Payer: Self-pay | Admitting: Obstetrics & Gynecology

## 2015-07-25 VITALS — BP 116/82 | HR 72 | Temp 98.5°F | Wt 226.0 lb

## 2015-07-25 DIAGNOSIS — R102 Pelvic and perineal pain: Secondary | ICD-10-CM

## 2015-07-25 DIAGNOSIS — N83202 Unspecified ovarian cyst, left side: Secondary | ICD-10-CM

## 2015-07-25 NOTE — Patient Instructions (Signed)
Ovarian Cyst An ovarian cyst is a fluid-filled sac that forms on an ovary. The ovaries are small organs that produce eggs in women. Various types of cysts can form on the ovaries. Most are not cancerous. Many do not cause problems, and they often go away on their own. Some may cause symptoms and require treatment. Common types of ovarian cysts include:  Functional cysts--These cysts may occur every month during the menstrual cycle. This is normal. The cysts usually go away with the next menstrual cycle if the woman does not get pregnant. Usually, there are no symptoms with a functional cyst.  Endometrioma cysts--These cysts form from the tissue that lines the uterus. They are also called "chocolate cysts" because they become filled with blood that turns brown. This type of cyst can cause pain in the lower abdomen during intercourse and with your menstrual period.  Cystadenoma cysts--This type develops from the cells on the outside of the ovary. These cysts can get very big and cause lower abdomen pain and pain with intercourse. This type of cyst can twist on itself, cut off its blood supply, and cause severe pain. It can also easily rupture and cause a lot of pain.  Dermoid cysts--This type of cyst is sometimes found in both ovaries. These cysts may contain different kinds of body tissue, such as skin, teeth, hair, or cartilage. They usually do not cause symptoms unless they get very big.  Theca lutein cysts--These cysts occur when too much of a certain hormone (human chorionic gonadotropin) is produced and overstimulates the ovaries to produce an egg. This is most common after procedures used to assist with the conception of a baby (in vitro fertilization). CAUSES   Fertility drugs can cause a condition in which multiple large cysts are formed on the ovaries. This is called ovarian hyperstimulation syndrome.  A condition called polycystic ovary syndrome can cause hormonal imbalances that can lead to  nonfunctional ovarian cysts. SIGNS AND SYMPTOMS  Many ovarian cysts do not cause symptoms. If symptoms are present, they may include:  Pelvic pain or pressure.  Pain in the lower abdomen.  Pain during sexual intercourse.  Increasing girth (swelling) of the abdomen.  Abnormal menstrual periods.  Increasing pain with menstrual periods.  Stopping having menstrual periods without being pregnant. DIAGNOSIS  These cysts are commonly found during a routine or annual pelvic exam. Tests may be ordered to find out more about the cyst. These tests may include:  Ultrasound.  X-ray of the pelvis.  CT scan.  MRI.  Blood tests. TREATMENT  Many ovarian cysts go away on their own without treatment. Your health care provider may want to check your cyst regularly for 2-3 months to see if it changes. For women in menopause, it is particularly important to monitor a cyst closely because of the higher rate of ovarian cancer in menopausal women. When treatment is needed, it may include any of the following:  A procedure to drain the cyst (aspiration). This may be done using a long needle and ultrasound. It can also be done through a laparoscopic procedure. This involves using a thin, lighted tube with a tiny camera on the end (laparoscope) inserted through a small incision.  Surgery to remove the whole cyst. This may be done using laparoscopic surgery or an open surgery involving a larger incision in the lower abdomen.  Hormone treatment or birth control pills. These methods are sometimes used to help dissolve a cyst. HOME CARE INSTRUCTIONS   Only take over-the-counter   or prescription medicines as directed by your health care provider.  Follow up with your health care provider as directed.  Get regular pelvic exams and Pap tests. SEEK MEDICAL CARE IF:   Your periods are late, irregular, or painful, or they stop.  Your pelvic pain or abdominal pain does not go away.  Your abdomen becomes  larger or swollen.  You have pressure on your bladder or trouble emptying your bladder completely.  You have pain during sexual intercourse.  You have feelings of fullness, pressure, or discomfort in your stomach.  You lose weight for no apparent reason.  You feel generally ill.  You become constipated.  You lose your appetite.  You develop acne.  You have an increase in body and facial hair.  You are gaining weight, without changing your exercise and eating habits.  You think you are pregnant. SEEK IMMEDIATE MEDICAL CARE IF:   You have increasing abdominal pain.  You feel sick to your stomach (nauseous), and you throw up (vomit).  You develop a fever that comes on suddenly.  You have abdominal pain during a bowel movement.  Your menstrual periods become heavier than usual. MAKE SURE YOU:  Understand these instructions.  Will watch your condition.  Will get help right away if you are not doing well or get worse.   This information is not intended to replace advice given to you by your health care provider. Make sure you discuss any questions you have with your health care provider.   Document Released: 06/01/2005 Document Revised: 06/06/2013 Document Reviewed: 02/06/2013 Elsevier Interactive Patient Education 2016 [employer]  

## 2015-07-25 NOTE — Progress Notes (Signed)
Patient ID: Bailey Hicks, female   DOB: [birth year], 25 y.o.   MRN: [id number] History:  25 y.o. G1P0010 here today for ED f/u.  Pt reports cycles that are 32-35 days. She doesn't know when she is ovulating. She is no longer having regular pain however reports that she will occ have a pain that she would before attribute to ovulation and now she does not know what it might. Pt declines OCP's and does not plan to conceive presently.  The following portions of the patient's history were reviewed and updated as appropriate: allergies, current medications, past family history, past medical history, past social history, past surgical history and problem list.  Review of Systems:  Pertinent items are noted in HPI.  Objective:  Physical Exam Blood pressure 116/82, pulse 72, temperature 98.5 F (36.9 C), weight 226 lb (102.513 kg), last menstrual period 07/04/2015. Gen: NAD Abd: Soft, nontender and nondistended Pelvic: Normal appearing external genitalia; normal appearing vaginal mucosa and cervix.  Normal discharge.  Small uterus, no other palpable masses, no uterine or adnexal tenderness  Labs and Imaging 11/19/2014 CLINICAL DATA: Pelvic pain and dysuria  EXAM: TRANSABDOMINAL AND TRANSVAGINAL ULTRASOUND OF PELVIS  TECHNIQUE: Both transabdominal and transvaginal ultrasound examinations of the pelvis were performed. Transabdominal technique was performed for global imaging of the pelvis including uterus, ovaries, adnexal regions, and pelvic cul-de-sac. It was necessary to proceed with endovaginal exam following the transabdominal exam to visualize the ovaries.  COMPARISON: None  FINDINGS: Uterus  Measurements: 7.5 x 3.6 x 4.8 cm. No fibroids or other mass visualized.  Endometrium  Thickness: 10.4 mm. No focal abnormality visualized.  Right ovary  Measurements: 3.1 x 2.5 x 2.0 cm. Normal appearance/no adnexal mass.  Left ovary  Measurements: 5.1 x 3.3 x 4.4 cm.  There is a 4.2 cm complex cyst in the left ovary.  Other findings  Small amount of free fluid is noted.  IMPRESSION: Complex left ovarian cyst is identified. This may simply represent a hemorrhagic cyst. Short-term followup is recommended to assess for stability or resolution.  Small amount of free pelvic fluid.  Assessment & Plan:  Left ov cyst- need to check from resolution Need to f/u for pelvic sono   Malichi Palardy L. Harraway-Smith, M.D., [facility]

## 2015-07-26 ENCOUNTER — Encounter (HOSPITAL_BASED_OUTPATIENT_CLINIC_OR_DEPARTMENT_OTHER): Payer: Self-pay | Admitting: *Deleted

## 2015-07-26 ENCOUNTER — Emergency Department (HOSPITAL_BASED_OUTPATIENT_CLINIC_OR_DEPARTMENT_OTHER)
Admission: EM | Admit: 2015-07-26 | Discharge: 2015-07-26 | Disposition: A | Discharge status: Home / Self Care | Payer: Self-pay | Attending: Physician Assistant | Admitting: Physician Assistant

## 2015-07-26 ENCOUNTER — Emergency Department (HOSPITAL_BASED_OUTPATIENT_CLINIC_OR_DEPARTMENT_OTHER)
Admission: EM | Admit: 2015-07-26 | Discharge: 2015-07-26 | Disposition: A | Discharge status: Home / Self Care | Payer: Self-pay | Attending: Emergency Medicine | Admitting: Emergency Medicine

## 2015-07-26 DIAGNOSIS — R220 Localized swelling, mass and lump, head: Secondary | ICD-10-CM | POA: Insufficient documentation

## 2015-07-26 DIAGNOSIS — Z4800 Encounter for change or removal of nonsurgical wound dressing: Secondary | ICD-10-CM | POA: Insufficient documentation

## 2015-07-26 DIAGNOSIS — J029 Acute pharyngitis, unspecified: Secondary | ICD-10-CM | POA: Insufficient documentation

## 2015-07-26 DIAGNOSIS — Z5189 Encounter for other specified aftercare: Secondary | ICD-10-CM

## 2015-07-26 DIAGNOSIS — X58XXXA Exposure to other specified factors, initial encounter: Secondary | ICD-10-CM

## 2015-07-26 LAB — RAPID STREP SCREEN (MED CTR MEBANE ONLY): STREPTOCOCCUS, GROUP A SCREEN (DIRECT): NEGATIVE

## 2015-07-26 NOTE — Discharge Instructions (Signed)
Cellulitis Continue warm compresses and follow up with the dermatologist. Return to the ED if you develop new or worsening symptoms. Cellulitis is an infection of the skin and the tissue beneath it. The infected area is usually red and tender. Cellulitis occurs most often in the arms and lower legs.  CAUSES  Cellulitis is caused by bacteria that enter the skin through cracks or cuts in the skin. The most common types of bacteria that cause cellulitis are staphylococci and streptococci. SIGNS AND SYMPTOMS   Redness and warmth.  Swelling.  Tenderness or pain.  Fever. DIAGNOSIS  Your health care provider can usually determine what is wrong based on a physical exam. Blood tests may also be done. TREATMENT  Treatment usually involves taking an antibiotic medicine. HOME CARE INSTRUCTIONS   Take your antibiotic medicine as directed by your health care provider. Finish the antibiotic even if you start to feel better.  Keep the infected arm or leg elevated to reduce swelling.  Apply a warm cloth to the affected area up to 4 times per day to relieve pain.  Take medicines only as directed by your health care provider.  Keep all follow-up visits as directed by your health care provider. SEEK MEDICAL CARE IF:   You notice red streaks coming from the infected area.  Your red area gets larger or turns dark in color.  Your bone or joint underneath the infected area becomes painful after the skin has healed.  Your infection returns in the same area or another area.  You notice a swollen bump in the infected area.  You develop new symptoms.  You have a fever. SEEK IMMEDIATE MEDICAL CARE IF:   You feel very sleepy.  You develop vomiting or diarrhea.  You have a general ill feeling (malaise) with muscle aches and pains.   This information is not intended to replace advice given to you by your health care provider. Make sure you discuss any questions you have with your health care  provider.   Document Released: 03/11/2005 Document Revised: 02/20/2015 Document Reviewed: 08/17/2011 Elsevier Interactive Patient Education [employer]

## 2015-07-26 NOTE — ED Notes (Signed)
Sore throat. Pt was triaged at 11am. Unable to find pt and her name was taken out of the computer. She has been sitting in the waiting room.

## 2015-07-26 NOTE — ED Notes (Signed)
Here strep screen was negative this am.

## 2015-07-26 NOTE — ED Notes (Signed)
No one was in the room on inspection.

## 2015-07-26 NOTE — ED Notes (Signed)
Sore throat x 2 days

## 2015-07-26 NOTE — ED Provider Notes (Signed)
CSN: [id number]     Arrival date & time 07/26/15  1452 History   First MD Initiated Contact with Patient 07/26/15 1526     Chief Complaint  Patient presents with  . Sore Throat     (Consider location/radiation/quality/duration/timing/severity/associated sxs/prior Treatment) HPI Comments: Patient presents for recheck of her left facial cellulitis. She's been on doxycycline for the past 2 days. She states she thought she had a "zit" and put some toothpaste on it which then caused her to have some purulent drainage. No fever or vomiting.  Also has had several days of sore throat.  Feels wound to face is stable from 2 days ago.  He has been using hot packs and doxycycline without relief. She's also been applying topical antibiotic ointment.  Patient is a 25 y.o. female presenting with pharyngitis.  Sore Throat Pertinent negatives include no abdominal pain, no headaches and no shortness of breath.    History reviewed. No pertinent past medical history. Past Surgical History  Procedure Laterality Date  . Wisdom tooth extraction    . Anterior cruciate ligament repair  Left knee   No family history on file. Social History  Substance Use Topics  . Smoking status: Never Smoker   . Smokeless tobacco: Never Used  . Alcohol Use: No   OB History    Gravida Para Term Preterm AB TAB SAB Ectopic Multiple Living   1 1       0 0     Review of Systems  Constitutional: Negative for fever, activity change and appetite change.  HENT: Positive for sore throat. Negative for congestion.   Respiratory: Negative for cough, chest tightness and shortness of breath.   Gastrointestinal: Negative for vomiting and abdominal pain.  Genitourinary: Negative for dysuria, hematuria, vaginal bleeding and vaginal discharge.  Musculoskeletal: Negative for myalgias and arthralgias.  Skin: Positive for wound.  Neurological: Negative for dizziness and headaches.  Psychiatric/Behavioral: Negative for hallucinations  and confusion.   A complete 10 system review of systems was obtained and all systems are negative except as noted in the HPI and PMH.     Allergies  Review of patient's allergies indicates no known allergies.  Home Medications   Prior to Admission medications   Medication Sig Start Date End Date Taking? Authorizing Provider  doxycycline (VIBRAMYCIN) 100 MG capsule Take 1 capsule (100 mg total) by mouth 2 (two) times daily. One po bid x 7 days Patient not taking: Reported on 07/25/2015 07/24/15   Dione Booze, MD   BP 144/87 mmHg  Pulse 80  Temp(Src) 98.2 F (36.8 C) (Oral)  Resp 18  Wt 225 lb (102.059 kg)  SpO2 99%  LMP 07/04/2015 Physical Exam  Constitutional: She is oriented to person, place, and time. She appears well-developed and well-nourished. No distress.  HENT:  Head: Normocephalic and atraumatic.  Mouth/Throat: Oropharynx is clear and moist. No oropharyngeal exudate.  No exudate or asymmtery.  Swelling over left cheek as depicted. There is no active drainage or bleeding. There is no fluctuance.  Eyes: Conjunctivae and EOM are normal. Pupils are equal, round, and reactive to light.  Neck: Normal range of motion. Neck supple.  No meningismus.  Cardiovascular: Normal rate, regular rhythm, normal heart sounds and intact distal pulses.   No murmur heard. Pulmonary/Chest: Effort normal and breath sounds normal. No respiratory distress.  Abdominal: Soft. There is no tenderness. There is no rebound and no guarding.  Musculoskeletal: Normal range of motion. She exhibits no edema or tenderness.  Neurological:  She is alert and oriented to person, place, and time. No cranial nerve deficit. She exhibits normal muscle tone. Coordination normal.  No ataxia on finger to nose bilaterally. No pronator drift. 5/5 strength throughout. CN 2-12 intact.Equal grip strength. Sensation intact.   Skin: Skin is warm.  Psychiatric: She has a normal mood and affect. Her behavior is normal.    Nursing note and vitals reviewed.   ED Course  Procedures (including critical care time) Labs Review Labs Reviewed - No data to display  Imaging Review No results found. I have personally reviewed and evaluated these images and lab results as part of my medical decision-making.   EKG Interpretation None      MDM   Final diagnoses:  Pharyngitis  Visit for wound check   recheck of left facial possible cellulitis. Also complains of sore throat.  Rapid strep negative. Throat exam is benign. Continue doxycycline, hot compresses, follow-up with dermatology.  Return precautions discussed.    Glynn Octave, MD 07/26/15 1800

## 2015-07-26 NOTE — ED Provider Notes (Signed)
  Went to patient's room for evaluation, no one present.  Patient did not tell anyone that she was leaving.  I did not see or evaluate patient.  Garlon Hatchet, PA-C 07/26/15 1249  Courteney Randall An, MD 07/26/15 1544

## 2015-07-27 LAB — CULTURE, GROUP A STREP (THRC)

## 2015-07-31 ENCOUNTER — Emergency Department (HOSPITAL_COMMUNITY)
Admission: EM | Admit: 2015-07-31 | Discharge: 2015-07-31 | Disposition: A | Discharge status: Home / Self Care | Payer: Self-pay | Attending: Emergency Medicine | Admitting: Emergency Medicine

## 2015-07-31 ENCOUNTER — Encounter (HOSPITAL_COMMUNITY): Payer: Self-pay

## 2015-07-31 ENCOUNTER — Emergency Department (HOSPITAL_COMMUNITY): Payer: Self-pay

## 2015-07-31 DIAGNOSIS — J36 Peritonsillar abscess: Secondary | ICD-10-CM | POA: Insufficient documentation

## 2015-07-31 DIAGNOSIS — H6122 Impacted cerumen, left ear: Secondary | ICD-10-CM | POA: Insufficient documentation

## 2015-07-31 LAB — CBC WITH DIFFERENTIAL/PLATELET
Basophils Absolute: 0 10*3/uL (ref 0.0–0.1)
Basophils Relative: 0 %
EOS PCT: 1 %
Eosinophils Absolute: 0.1 10*3/uL (ref 0.0–0.7)
HCT: 38.7 % (ref 36.0–46.0)
Hemoglobin: 12.5 g/dL (ref 12.0–15.0)
LYMPHS ABS: 3.3 10*3/uL (ref 0.7–4.0)
LYMPHS PCT: 43 %
MCH: 28.7 pg (ref 26.0–34.0)
MCHC: 32.3 g/dL (ref 30.0–36.0)
MCV: 88.8 fL (ref 78.0–100.0)
MONO ABS: 0.5 10*3/uL (ref 0.1–1.0)
MONOS PCT: 7 %
Neutro Abs: 3.7 10*3/uL (ref 1.7–7.7)
Neutrophils Relative %: 49 %
PLATELETS: 269 10*3/uL (ref 150–400)
RBC: 4.36 MIL/uL (ref 3.87–5.11)
RDW: 13.9 % (ref 11.5–15.5)
WBC: 7.6 10*3/uL (ref 4.0–10.5)

## 2015-07-31 LAB — BASIC METABOLIC PANEL
Anion gap: 9 (ref 5–15)
BUN: 16 mg/dL (ref 6–20)
CO2: 25 mmol/L (ref 22–32)
CREATININE: 0.8 mg/dL (ref 0.44–1.00)
Calcium: 9.8 mg/dL (ref 8.9–10.3)
Chloride: 105 mmol/L (ref 101–111)
GFR calc Af Amer: >60 mL/min (ref >60)
GLUCOSE: 94 mg/dL (ref 65–99)
POTASSIUM: 3.9 mmol/L (ref 3.5–5.1)
Sodium: 139 mmol/L (ref 135–145)

## 2015-07-31 MED ORDER — AMOXICILLIN 500 MG PO CAPS
1000.0000 mg | ORAL_CAPSULE | Freq: Once | ORAL | Status: AC
  Administered 2015-07-31: 1000 mg via ORAL
  Filled 2015-07-31: qty 2

## 2015-07-31 MED ORDER — DEXAMETHASONE SODIUM PHOSPHATE 10 MG/ML IJ SOLN
10.0000 mg | Freq: Once | INTRAMUSCULAR | Status: AC
  Administered 2015-07-31: 10 mg via INTRAVENOUS
  Filled 2015-07-31: qty 1

## 2015-07-31 MED ORDER — AMOXICILLIN 500 MG PO CAPS: 1000.0000 mg | ORAL_CAPSULE | Freq: Three times a day (TID) | ORAL | Status: DC

## 2015-07-31 MED ORDER — IOHEXOL 300 MG/ML  SOLN
75.0000 mL | Freq: Once | INTRAMUSCULAR | Status: AC | PRN
  Administered 2015-07-31: 75 mL via INTRAVENOUS

## 2015-07-31 NOTE — ED Notes (Signed)
Patient has swelling to the left face x 2 days. Patient was seen 5 days ago for an abscess on the left face. Patient states she just finished a round of doxycycline.

## 2015-07-31 NOTE — ED Notes (Signed)
Patient transported to CT 

## 2015-07-31 NOTE — Discharge Instructions (Signed)
Peritonsillar Abscess °A peritonsillar abscess is a collection of yellowish-white fluid (pus) in the back of the throat behind the tonsils. It usually occurs when an infection of the throat or tonsils (tonsillitis) spreads into the tissues around the tonsils. °CAUSES °The infection that leads to a peritonsillar abscess is usually caused by streptococcal bacteria.  °SIGNS AND SYMPTOMS °· Sore throat, often with pain on just one side. °· Swelling and tenderness of the glands (lymph nodes) in the neck. °· Difficulty swallowing. °· Difficulty opening your mouth. °· Fever. °· Chills. °· Drooling because of difficulty swallowing saliva. °· Headache. °· Changes in your voice. °· Bad breath. °DIAGNOSIS °Your health care provider will take your medical history and do a physical exam. Imaging tests may be done, such as an ultrasound or CT scan. A sample of pus may be removed from the abscess using a needle (needle aspiration) or by swabbing the back of your throat. This sample will be sent to a lab for testing. °TREATMENT °Treatment usually involves draining the pus from the abscess. This may be done through needle aspiration or by making an incision in the abscess. You will also likely need to take antibiotic medicine. °HOME CARE INSTRUCTIONS °· Rest as much as possible and get plenty of sleep. °· Take medicines only as directed by your health care provider. °· If you were prescribed an antibiotic medicine, finish it all even if you start to feel better. °· If your abscess was drained by your health care provider, gargle with a mixture of salt and warm water: °¨ Mix 1 tsp of salt in 8 oz of warm water. °¨ Gargle with this mixture four times per day or as needed for comfort. °¨ Do not swallow this mixture. °· Drink plenty of fluids. °· While your throat is sore, eat soft or liquid foods, such as frozen ice pops and ice cream. °· Keep all follow-up visits as directed by your health care provider. This is important. °SEEK  MEDICAL CARE IF: °· You have increased pain, swelling, redness, or drainage in your throat. °· You develop a headache, a lack of energy (lethargy), or generalized feelings of illness. °· You have a fever. °· You feel dizzy. °· You have difficulty swallowing or eating. °· You show signs of becoming dehydrated, such as: °¨ Light-headedness when standing. °¨ Decreased urine output. °¨ A fast heart rate. °¨ Dry mouth. °SEEK IMMEDIATE MEDICAL CARE IF:  °· You have difficulty talking or breathing, or you find it easier to breathe when you lean forward. °· You are coughing up blood or vomiting blood. °· You have severe throat pain that is not helped by medicines. °· You start to drool. °  °This information is not intended to replace advice given to you by your health care provider. Make sure you discuss any questions you have with your health care provider. °  °Document Released: 06/01/2005 Document Revised: 06/22/2014 Document Reviewed: 01/15/2014 °Elsevier Interactive Patient Education ©2016 Elsevier Inc. ° °

## 2015-07-31 NOTE — ED Provider Notes (Signed)
CSN: [id number]     Arrival date & time 07/31/15  1523 History  By signing my name below, I, Freida Busman, attest that this documentation has been prepared under the direction and in the presence of non-physician practitioner, Arthor Captain, PA-C. Electronically Signed: Freida Busman, Scribe. 07/31/2015. 5:21 PM.     Chief Complaint  Patient presents with  . Abscess  . Facial Swelling   The history is provided by the patient. No language interpreter was used.     HPI Comments:  Bailey Hicks is a 25 y.o. female who presents to the Emergency Department complaining of mild-moderate upper neck swelling which she first noticed a few days ago. Pt denies neck pain and dental pain. She was recently evaluated in the ED for abscess to her left cheek. She received 7 day course of doxycycline which she has been taking with improvement. Pt denies fever, chills, body aches, voice changes, difficulty swallowing, and SOB. No alleviating factors noted.  Pt was also seen in the ED on  07/26/15 for sore throat.She had a negative strep but culture grew out beta hemolytic strep non group A.  History reviewed. No pertinent past medical history. Past Surgical History  Procedure Laterality Date  . Wisdom tooth extraction    . Anterior cruciate ligament repair  Left knee   History reviewed. No pertinent family history. Social History  Substance Use Topics  . Smoking status: Never Smoker   . Smokeless tobacco: Never Used  . Alcohol Use: No   OB History    Gravida Para Term Preterm AB TAB SAB Ectopic Multiple Living   1 1       0 0     Review of Systems  Constitutional: Negative for fever and chills.  HENT: Positive for facial swelling (neck). Negative for dental problem, trouble swallowing and voice change.   Respiratory: Negative for shortness of breath.   Musculoskeletal: Negative for myalgias and neck pain.    Allergies  Review of patient's allergies indicates no known allergies.  Home  Medications   Prior to Admission medications   Medication Sig Start Date End Date Taking? Authorizing Provider  doxycycline (VIBRAMYCIN) 100 MG capsule Take 1 capsule (100 mg total) by mouth 2 (two) times daily. One po bid x 7 days Patient not taking: Reported on 07/25/2015 07/24/15   Dione Booze, MD   BP 122/66 mmHg  Pulse 73  Temp(Src) 98.4 F (36.9 C) (Oral)  Resp 16  Ht  (1.626 m)  Wt 220 lb (99.791 kg)  BMI 37.74 kg/m2  SpO2 99%  LMP 07/04/2015 Physical Exam  Constitutional: She is oriented to person, place, and time. She appears well-developed and well-nourished. No distress.  HENT:  Head: Normocephalic and atraumatic.  Mouth/Throat: Oropharynx is clear and moist.  No abnormal dentition Cerumen impaction of left ear Oropharynx clear, uvula midline  Eyes: Conjunctivae are normal.  Neck:  No palpable swelling or adenopathy to submandibular region. No sublingual swelling There is tenderness to left SCM She complains of pain that radiates into left mastoid process Pt has adipose tissue in her neck that inhibits thorough examine of anterior neck nml phonation  Cardiovascular: Normal rate.   Pulmonary/Chest: Effort normal. No stridor.  Abdominal: She exhibits no distension.  Neurological: She is alert and oriented to person, place, and time.  Skin: Skin is warm and dry.  Psychiatric: She has a normal mood and affect.  Nursing note and vitals reviewed.   ED Course  Procedures   DIAGNOSTIC  STUDIES:  Oxygen Saturation is 99% on RA, normal by my interpretation.    COORDINATION OF CARE:  4:57 PM Discussed treatment plan with pt at bedside and pt agreed to plan.   MDM   Final diagnoses:  None    Patient with swelling of the submandibular region. Exam is rather unimpressive  But she c/o difficulty swallowing. Will CT soft tissues of neck to r/o deep space infection. I have give sign out to PA greene who will assume care of the patient.    I personally  performed the services described in this documentation, which was scribed in my presence. The recorded information has been reviewed and is accurate.       Arthor Captain, PA-C 07/31/15 2015  Azalia Bilis, MD 08/01/15 [phone]

## 2015-07-31 NOTE — ED Provider Notes (Signed)
Patient sign out from [place], [place] She is here today for throat swelling and tightness. She was recently treated with Doxycycline for a cheek abscess and has completed the course. A few days ago she started with her new symptoms. She is not having any pain in the neck or dental pain.   On 07/26/15 her strep culture grew beta hemolytic strep non group A.  Per Tiburcio Pea, PA-C physical Exam Neck:  No palpable swelling or adenopathy to submandibular region. No sublingual swelling There is tenderness to left SCM She complains of pain that radiates into left mastoid process Pt has adipose tissue in her neck that inhibits thorough examine of anterior neck nml phonation   CT scan of the soft tissue of the neck was obtained to r/o deep space infection.  The CT scan has resulted showing 1 cm peritonsillar abscess. I consulted Dr. Lazarus Salines with ENT, will give patient a shot of Decadon and 1 gram of amoxicillin. Rx:   amoxicillin (AMOXIL) 500 MG capsule Take 2 capsules (1,000 mg total) by mouth 3 (three) times daily. 1 gram of Amoxicillin TID for 3 days, then 500 mg Amoxicillin TID for 7 days. (Disp sufficient quantity please) 40 capsule Marlon Pel, PA-C   He will see patient as needed in the office, these typically do very well. Given return precautions and f./u with ENT guidelines.  Patient is stable showing no signs of distress, stridor, SOB, difficulty breathing. She is afebrile and well appearing.   Marlon Pel, PA-C 07/31/15 2054  Azalia Bilis, MD 08/01/15 [phone]

## 2015-08-01 ENCOUNTER — Emergency Department (HOSPITAL_COMMUNITY)
Admission: EM | Admit: 2015-08-01 | Discharge: 2015-08-01 | Disposition: A | Discharge status: Home / Self Care | Payer: Self-pay | Attending: Emergency Medicine | Admitting: Emergency Medicine

## 2015-08-01 ENCOUNTER — Encounter (HOSPITAL_COMMUNITY): Payer: Self-pay | Admitting: Emergency Medicine

## 2015-08-01 DIAGNOSIS — M549 Dorsalgia, unspecified: Secondary | ICD-10-CM | POA: Insufficient documentation

## 2015-08-01 DIAGNOSIS — Y998 Other external cause status: Secondary | ICD-10-CM | POA: Insufficient documentation

## 2015-08-01 DIAGNOSIS — R079 Chest pain, unspecified: Secondary | ICD-10-CM | POA: Insufficient documentation

## 2015-08-01 DIAGNOSIS — Y9289 Other specified places as the place of occurrence of the external cause: Secondary | ICD-10-CM | POA: Insufficient documentation

## 2015-08-01 DIAGNOSIS — T7840XA Allergy, unspecified, initial encounter: Secondary | ICD-10-CM | POA: Insufficient documentation

## 2015-08-01 DIAGNOSIS — Y9389 Activity, other specified: Secondary | ICD-10-CM | POA: Insufficient documentation

## 2015-08-01 MED ORDER — DIPHENHYDRAMINE HCL 25 MG PO CAPS: 25.0000 mg | ORAL_CAPSULE | Freq: Once | ORAL | Status: DC

## 2015-08-01 NOTE — ED Notes (Signed)
Pt called  No response from lobby  

## 2015-08-01 NOTE — ED Notes (Signed)
Pt states she was seen earlier tonight here for a peritonsillar abscess  Pt states she was given amoxicillin and a steroid  Pt states after she left she had chills but they have resolved and has chest pain that feels like heart burn and back pain  Pt states she also feels warm all over   Pt denies any difficulty breathing or swallowing

## 2015-08-02 ENCOUNTER — Emergency Department (HOSPITAL_COMMUNITY)
Admission: EM | Admit: 2015-08-02 | Discharge: 2015-08-03 | Disposition: A | Discharge status: Home / Self Care | Payer: Self-pay | Attending: Physician Assistant | Admitting: Physician Assistant

## 2015-08-02 ENCOUNTER — Emergency Department (HOSPITAL_COMMUNITY): Payer: Self-pay

## 2015-08-02 ENCOUNTER — Ambulatory Visit (HOSPITAL_COMMUNITY): Payer: Self-pay | Attending: Obstetrics & Gynecology

## 2015-08-02 ENCOUNTER — Encounter (HOSPITAL_COMMUNITY): Payer: Self-pay | Admitting: Emergency Medicine

## 2015-08-02 DIAGNOSIS — R070 Pain in throat: Secondary | ICD-10-CM | POA: Insufficient documentation

## 2015-08-02 DIAGNOSIS — R0789 Other chest pain: Secondary | ICD-10-CM | POA: Insufficient documentation

## 2015-08-02 DIAGNOSIS — R51 Headache: Secondary | ICD-10-CM | POA: Insufficient documentation

## 2015-08-02 LAB — CBC
HCT: 35.8 % — ABNORMAL LOW (ref 36.0–46.0)
HEMOGLOBIN: 11.7 g/dL — AB (ref 12.0–15.0)
MCH: 28.7 pg (ref 26.0–34.0)
MCHC: 32.7 g/dL (ref 30.0–36.0)
MCV: 87.7 fL (ref 78.0–100.0)
PLATELETS: 253 10*3/uL (ref 150–400)
RBC: 4.08 MIL/uL (ref 3.87–5.11)
RDW: 13.9 % (ref 11.5–15.5)
WBC: 8.7 10*3/uL (ref 4.0–10.5)

## 2015-08-02 LAB — BASIC METABOLIC PANEL
ANION GAP: 8 (ref 5–15)
BUN: 16 mg/dL (ref 6–20)
CHLORIDE: 103 mmol/L (ref 101–111)
CO2: 25 mmol/L (ref 22–32)
CREATININE: 0.9 mg/dL (ref 0.44–1.00)
Calcium: 9.2 mg/dL (ref 8.9–10.3)
GFR calc non Af Amer: >60 mL/min (ref >60)
Glucose, Bld: 109 mg/dL — ABNORMAL HIGH (ref 65–99)
POTASSIUM: 3.6 mmol/L (ref 3.5–5.1)
SODIUM: 136 mmol/L (ref 135–145)

## 2015-08-02 LAB — I-STAT TROPONIN, ED: Troponin i, poc: 0 ng/mL (ref 0.00–0.08)

## 2015-08-02 NOTE — ED Notes (Signed)
Pt states that she has had a chest pain and headache x 1 day and she also has a peritonsillar abcess that she is currently being treated for but she states she does not feel any better. Alert and oriented.

## 2015-08-02 NOTE — Progress Notes (Signed)
EDCM spoke to patient at bedside. Patient confirms she does not have a pcp or insurance living in [place].  [facility] provided patient with contact information to [facility], informed patient of services there and walk in times.  EDCM also provided patient with list of pcps who accept self pay patients, list of discount pharmacies and websites needymeds.org and GoodRX.com for medication assistance, phone number to inquire about the orange card, phone number to inquire about Mediciad, phone number to inquire about the Affordable Care Act, financial resources in the community such as local churches, [employer], urban ministries, and dental assistance for uninsured patients.  Patient thankful for resources.  No further EDCM needs at this time.  Patient agreeable to have [facility] place referral to [facility] to establish care and to speak to financial counselor regarding orange card.  Referral complete.

## 2015-08-03 MED ORDER — ACETAMINOPHEN-CODEINE 120-12 MG/5ML PO SOLN
10.0000 mL | ORAL | Status: DC | PRN
Start: 2015-08-03 — End: 2015-08-16

## 2015-08-03 NOTE — ED Provider Notes (Signed)
CSN: [id number]     Arrival date & time 08/02/15  2044 History   First MD Initiated Contact with Patient 08/02/15 2258     Chief Complaint  Patient presents with  . Headache  . Chest Pain     (Consider location/radiation/quality/duration/timing/severity/associated sxs/prior Treatment) HPI Patient presents to the emergency department with chest discomfort, although with throat pain.  The patient was recently diagnosed with peritonsillar abscess.  She did not follow up with ENT as directed.  The patient states that she attempted to call them today without success.  The patient denies nausea, vomiting, weakness, dizziness, headache, blurred vision, back pain, dysuria, incontinence, shortness of breath, fever, near syncope or syncope.  Patient states nothing seems make the condition better or worse History reviewed. No pertinent past medical history. Past Surgical History  Procedure Laterality Date  . Wisdom tooth extraction    . Anterior cruciate ligament repair  Left knee   History reviewed. No pertinent family history. Social History  Substance Use Topics  . Smoking status: Never Smoker   . Smokeless tobacco: Never Used  . Alcohol Use: No   OB History    Gravida Para Term Preterm AB TAB SAB Ectopic Multiple Living   1 1       0 0     Review of Systems All other systems negative except as documented in the HPI. All pertinent positives and negatives as reviewed in the HPI.   Allergies  Review of patient's allergies indicates no known allergies.  Home Medications   Prior to Admission medications   Medication Sig Start Date End Date Taking? Authorizing Provider  amoxicillin (AMOXIL) 500 MG capsule Take 2 capsules (1,000 mg total) by mouth 3 (three) times daily. 1 gram of Amoxicillin TID for 3 days, then 500 mg Amoxicillin TID for 7 days. (Disp sufficient quantity please) 07/31/15  Yes Tiffany Neva Seat, PA-C  dextromethorphan-guaiFENesin (MUCINEX DM) 30-600 MG 12hr tablet Take 1  tablet by mouth 2 (two) times daily as needed for cough.   Yes Historical Provider, MD  acetaminophen-codeine 120-12 MG/5ML solution Take 10 mLs by mouth every 4 (four) hours as needed for moderate pain. 08/03/15   Charlestine Night, PA-C  doxycycline (VIBRAMYCIN) 100 MG capsule Take 1 capsule (100 mg total) by mouth 2 (two) times daily. One po bid x 7 days Patient not taking: Reported on 08/01/2015 07/24/15   Dione Booze, MD  naproxen sodium (ANAPROX) 220 MG tablet Take 440 mg by mouth every 12 (twelve) hours as needed (pain).    Historical Provider, MD   BP 109/62 mmHg  Pulse 57  Temp(Src) 97.7 F (36.5 C) (Oral)  Resp 16  SpO2 100%  LMP 07/04/2015 Physical Exam  Constitutional: She is oriented to person, place, and time. She appears well-developed and well-nourished. No distress.  HENT:  Head: Normocephalic and atraumatic.  Mouth/Throat: Posterior oropharyngeal edema and posterior oropharyngeal erythema present. No oropharyngeal exudate.  Eyes: Pupils are equal, round, and reactive to light.  Neck: Normal range of motion. Neck supple.  Cardiovascular: Normal rate, regular rhythm and normal heart sounds.  Exam reveals no gallop and no friction rub.   No murmur heard. Pulmonary/Chest: Effort normal and breath sounds normal. No respiratory distress. She has no wheezes. She exhibits no tenderness.  Musculoskeletal: She exhibits no edema.  Neurological: She is alert and oriented to person, place, and time. She exhibits normal muscle tone. Coordination normal.  Skin: Skin is warm and dry. No rash noted. No erythema.  Psychiatric: She  has a normal mood and affect. Her behavior is normal.  Nursing note and vitals reviewed.   ED Course  Procedures (including critical care time) Labs Review Labs Reviewed  BASIC METABOLIC PANEL - Abnormal; Notable for the following:    Glucose, Bld 109 (*)    All other components within normal limits  CBC - Abnormal; Notable for the following:     Hemoglobin 11.7 (*)    HCT 35.8 (*)    All other components within normal limits  I-STAT TROPOININ, ED    Imaging Review Dg Chest 2 View  08/02/2015  CLINICAL DATA:  Sternal chest pain and sinus drainage beginning today. Anterior neck pain for 4 days. EXAM: CHEST  2 VIEW COMPARISON:  Chest radiograph July 07, 2015 FINDINGS: Cardiomediastinal silhouette is normal. The lungs are clear without pleural effusions or focal consolidations. Trachea projects midline and there is no pneumothorax. Soft tissue planes and included osseous structures are non-suspicious. IMPRESSION: Normal chest. Electronically Signed   By: Awilda Metro M.D.   On: 08/02/2015 21:22   I have personally reviewed and evaluated these images and lab results as part of my medical decision-making.   EKG Interpretation None      MDM   Final diagnoses:  Chest wall pain  Throat pain in adult     Bailey Hicks speaking in full sentences without muffled voice.  She has normal airway and is intact.  Patient is advised she needs follow-up with ENT.  Told to return here as needed.  She does have some swelling to the right side of her throat    Charlestine Night, PA-C 08/03/15 0018  Courteney Randall An, MD 08/04/15 1610

## 2015-08-03 NOTE — ED Notes (Signed)
Patient d/c'd self care.  F/U and medications reviewed.  Patient verbalized understanding. 

## 2015-08-03 NOTE — Discharge Instructions (Signed)
Return here as needed.  Follow-up with the ENT doctor.  Your testing here tonight did not show any significant abnormality

## 2015-08-05 ENCOUNTER — Telehealth: Payer: Self-pay

## 2015-08-05 NOTE — Telephone Encounter (Signed)
Message received from Radford Pax, RN CM requesting a hospital follow up appointment and financial counseling appoint for the patient. Call placed to # [phone] (M) and the voice mailbox was full - unable to leave a message.  Update provided to A. Bennie Dallas, RN CM.

## 2015-08-05 NOTE — Telephone Encounter (Signed)
Attempted again to contact the patient to discuss scheduling a follow up appointment at the [facility]. Call placed to # [phone] (M) and the voicemail box was full.

## 2015-08-06 ENCOUNTER — Telehealth: Payer: Self-pay

## 2015-08-06 NOTE — Telephone Encounter (Signed)
Attempted again to contact the patient to discuss scheduling a follow up appointment at the [facility]. Call placed to # [phone] (M) and the voicemail box was full.  

## 2015-08-07 ENCOUNTER — Telehealth: Payer: Self-pay

## 2015-08-07 NOTE — Telephone Encounter (Signed)
Attempted to contact the patient again about scheduling a hospital follow up appointment.  Call placed to [phone] (M) and the voice mailbox was full.  Update provided to Amy. Bennie Dallas, RN CM

## 2015-08-09 ENCOUNTER — Encounter (HOSPITAL_COMMUNITY): Payer: Self-pay | Admitting: Emergency Medicine

## 2015-08-09 ENCOUNTER — Emergency Department (HOSPITAL_COMMUNITY)
Admission: EM | Admit: 2015-08-09 | Discharge: 2015-08-09 | Disposition: A | Discharge status: Home / Self Care | Payer: Self-pay | Attending: Emergency Medicine | Admitting: Emergency Medicine

## 2015-08-09 DIAGNOSIS — R519 Headache, unspecified: Secondary | ICD-10-CM

## 2015-08-09 DIAGNOSIS — R0981 Nasal congestion: Secondary | ICD-10-CM

## 2015-08-09 DIAGNOSIS — J3489 Other specified disorders of nose and nasal sinuses: Secondary | ICD-10-CM | POA: Insufficient documentation

## 2015-08-09 DIAGNOSIS — R51 Headache: Secondary | ICD-10-CM | POA: Insufficient documentation

## 2015-08-09 DIAGNOSIS — Z79899 Other long term (current) drug therapy: Secondary | ICD-10-CM | POA: Insufficient documentation

## 2015-08-09 DIAGNOSIS — H9203 Otalgia, bilateral: Secondary | ICD-10-CM | POA: Insufficient documentation

## 2015-08-09 MED ORDER — AMOXICILLIN-POT CLAVULANATE 875-125 MG PO TABS: 1.0000 | ORAL_TABLET | Freq: Two times a day (BID) | ORAL | Status: DC

## 2015-08-09 NOTE — Discharge Instructions (Signed)
General Headache Without Cause °A headache is pain or discomfort felt around the head or neck area. The specific cause of a headache may not be found. There are many causes and types of headaches. A few common ones are: °· Tension headaches. °· Migraine headaches. °· Cluster headaches. °· Chronic daily headaches. °HOME CARE INSTRUCTIONS  °Watch your condition for any changes. Take these steps to help with your condition: °Managing Pain °· Take over-the-counter and prescription medicines only as told by your health care provider. °· Lie down in a dark, quiet room when you have a headache. °· If directed, apply ice to the head and neck area: °· Put ice in a plastic bag. °· Place a towel between your skin and the bag. °· Leave the ice on for 20 minutes, 2-3 times per day. °· Use a heating pad or hot shower to apply heat to the head and neck area as told by your health care provider. °· Keep lights dim if bright lights bother you or make your headaches worse. °Eating and Drinking °· Eat meals on a regular schedule. °· Limit alcohol use. °· Decrease the amount of caffeine you drink, or stop drinking caffeine. °General Instructions °· Keep all follow-up visits as told by your health care provider. This is important. °· Keep a headache journal to help find out what may trigger your headaches. For example, write down: °¨ What you eat and drink. °¨ How much sleep you get. °¨ Any change to your diet or medicines. °· Try massage or other relaxation techniques. °· Limit stress. °· Sit up straight, and do not tense your muscles. °· Do not use tobacco products, including cigarettes, chewing tobacco, or e-cigarettes. If you need help quitting, ask your health care provider. °· Exercise regularly as told by your health care provider. °· Sleep on a regular schedule. Get 7-9 hours of sleep, or the amount recommended by your health care provider. °SEEK MEDICAL CARE IF:  °· Your symptoms are not helped by medicine. °· You have a  headache that is different from the usual headache. °· You have nausea or you vomit. °· You have a fever. °SEEK IMMEDIATE MEDICAL CARE IF:  °· Your headache becomes severe. °· You have repeated vomiting. °· You have a stiff neck. °· You have a loss of vision. °· You have problems with speech. °· You have pain in the eye or ear. °· You have muscular weakness or loss of muscle control. °· You lose your balance or have trouble walking. °· You feel faint or pass out. °· You have confusion. °  °This information is not intended to replace advice given to you by your health care provider. Make sure you discuss any questions you have with your health care provider. °  °Document Released: 06/01/2005 Document Revised: 02/20/2015 Document Reviewed: 09/24/2014 °Elsevier Interactive Patient Education ©2016 [employer] ° °Earache °An earache, also called otalgia, can be caused by many things. Pain from an earache can be sharp, dull, or burning. The pain may be temporary or constant. °Earaches can be caused by problems with the ear, such as infection in either the middle ear or the ear canal, injury, impacted ear wax, middle ear pressure, or a foreign body in the ear. Ear pain can also result from problems in other areas. This is called referred pain. For example, pain can come from a sore throat, a tooth infection, or problems with the jaw or the joint between the jaw and the skull (temporomandibular joint,   or TMJ). °The cause of an earache is not always easy to identify. Watchful waiting may be appropriate for some earaches until a clear cause of the pain can be found. °HOME CARE INSTRUCTIONS °Watch your condition for any changes. The following actions may help to lessen any discomfort that you are feeling: °· Take medicines only as directed by your health care provider. This includes ear drops. °· Apply ice to your outer ear to help reduce pain. °¨ Put ice in a plastic bag. °¨ Place a towel between your skin and the  bag. °¨ Leave the ice on for 20 minutes, 2-3 times per day. °· Do not put anything in your ear other than medicine that is prescribed by your health care provider. °· Try resting in an upright position instead of lying down. This may help to reduce pressure in the middle ear and relieve pain. °· Chew gum if it helps to relieve your ear pain. °· Control any allergies that you have. °· Keep all follow-up visits as directed by your health care provider. This is important. °SEEK MEDICAL CARE IF: °· Your pain does not improve within 2 days. °· You have a fever. °· You have new or worsening symptoms. °SEEK IMMEDIATE MEDICAL CARE IF: °· You have a severe headache. °· You have a stiff neck. °· You have difficulty swallowing. °· You have redness or swelling behind your ear. °· You have drainage from your ear. °· You have hearing loss. °· You feel dizzy. °  °This information is not intended to replace advice given to you by your health care provider. Make sure you discuss any questions you have with your health care provider. °  °Document Released: 01/17/2004 Document Revised: 06/22/2014 Document Reviewed: 12/31/2013 °Elsevier Interactive Patient Education ©2016 Elsevier Inc. ° °

## 2015-08-09 NOTE — ED Notes (Signed)
Pt states she has had a headache and bilateral earache for the past two weeks  Pt states she has been taking amoxicillin for the past week without relief

## 2015-08-09 NOTE — ED Provider Notes (Signed)
CSN: [id number]     Arrival date & time 08/09/15  0212 History   First MD Initiated Contact with Patient 08/09/15 [phone]     Chief Complaint  Patient presents with  . Headache  . Otalgia     (Consider location/radiation/quality/duration/timing/severity/associated sxs/prior Treatment) HPI Comments: Patient presents emergency department with chief complaint of otalgia. She also complains of some sinus congestion and sinus headache. She states that she has been having these symptoms for the past week or 2. She was seen earlier this month and reportedly diagnosed with peritonsillar abscess. She has been taking amoxicillin for this. She denies any associated fevers or chills. Denies any nausea or vomiting. She states that she has not had any relief from the antibiotic in regards to her otalgia and sinus congestion, however she no longer complains of sore throat.  The history is provided by the patient. No language interpreter was used.    History reviewed. No pertinent past medical history. Past Surgical History  Procedure Laterality Date  . Wisdom tooth extraction    . Anterior cruciate ligament repair  Left knee   Family History  Problem Relation Age of Onset  . Hypertension Other    Social History  Substance Use Topics  . Smoking status: Never Smoker   . Smokeless tobacco: Never Used  . Alcohol Use: 0.0 oz/week    0 Standard drinks or equivalent per week     Comment: occ   OB History    Gravida Para Term Preterm AB TAB SAB Ectopic Multiple Living   1 1       0 0     Review of Systems  Constitutional: Negative for fever and chills.  Respiratory: Negative for shortness of breath.   Cardiovascular: Negative for chest pain.  Gastrointestinal: Negative for nausea, vomiting, diarrhea and constipation.  Genitourinary: Negative for dysuria.  All other systems reviewed and are negative.     Allergies  Review of patient's allergies indicates no known allergies.  Home  Medications   Prior to Admission medications   Medication Sig Start Date End Date Taking? Authorizing Provider  acetaminophen-codeine 120-12 MG/5ML solution Take 10 mLs by mouth every 4 (four) hours as needed for moderate pain. 08/03/15  Yes Christopher Lawyer, PA-C  amoxicillin (AMOXIL) 500 MG capsule Take 2 capsules (1,000 mg total) by mouth 3 (three) times daily. 1 gram of Amoxicillin TID for 3 days, then 500 mg Amoxicillin TID for 7 days. (Disp sufficient quantity please) 07/31/15  Yes Tiffany Neva Seat, PA-C  naproxen sodium (ANAPROX) 220 MG tablet Take 440 mg by mouth every 12 (twelve) hours as needed (pain).   Yes Historical Provider, MD  sodium-potassium bicarbonate (ALKA-SELTZER GOLD) TBEF dissolvable tablet Take 1 tablet by mouth daily.   Yes Historical Provider, MD  doxycycline (VIBRAMYCIN) 100 MG capsule Take 1 capsule (100 mg total) by mouth 2 (two) times daily. One po bid x 7 days Patient not taking: Reported on 08/01/2015 07/24/15   Dione Booze, MD   BP 122/70 mmHg  Pulse 75  Temp(Src) 98.3 F (36.8 C) (Oral)  Resp 16  SpO2 100%  LMP 08/04/2015 (Exact Date) Physical Exam  Constitutional: She is oriented to person, place, and time. She appears well-developed and well-nourished.  HENT:  Head: Normocephalic and atraumatic.  Congestion behind TMs bilaterally Oropharynx is clear, no evidence of abscess, no exudate, no edema Normal phonation No stridor  Eyes: Conjunctivae and EOM are normal. Pupils are equal, round, and reactive to light.  Neck: Normal range  of motion. Neck supple.  Cardiovascular: Normal rate and regular rhythm.  Exam reveals no gallop and no friction rub.   No murmur heard. Pulmonary/Chest: Effort normal and breath sounds normal. No respiratory distress. She has no wheezes. She has no rales. She exhibits no tenderness.  Abdominal: Soft. Bowel sounds are normal. She exhibits no distension and no mass. There is no tenderness. There is no rebound and no guarding.   Musculoskeletal: Normal range of motion. She exhibits no edema or tenderness.  Neurological: She is alert and oriented to person, place, and time.  CN III-12 intact, speech is clear, movements are goal oriented  Skin: Skin is warm and dry.  Psychiatric: She has a normal mood and affect. Her behavior is normal. Judgment and thought content normal.  Nursing note and vitals reviewed.   ED Course  Procedures (including critical care time)   MDM   Final diagnoses:  Otalgia of both ears  Nonintractable headache, unspecified chronicity pattern, unspecified headache type  Sinus congestion    Patient with bilateral otalgia. Also complains of sinus congestion and sinus pressure. We'll step up antibiotic to Augmentin. Stressed the importance of ENT follow-up.    Roxy Horseman, PA-C 08/09/15 6045  Raeford Razor, MD 08/09/15 [phone]

## 2015-08-12 ENCOUNTER — Telehealth: Payer: Self-pay

## 2015-08-12 NOTE — Telephone Encounter (Signed)
Call placed to the patient and scheduled an appointment for 08/16/15 @ 0900.  Also discussed the importance of scheduling an appointment with the financial counselor for the orange card/[facility].  Update provided the Radford Pax, RN CM.

## 2015-08-16 ENCOUNTER — Ambulatory Visit: Payer: Self-pay | Attending: Internal Medicine | Admitting: Physician Assistant

## 2015-08-16 VITALS — BP 122/77 | HR 75 | Temp 97.4°F | Resp 16 | Ht 65.0 in | Wt 220.0 lb

## 2015-08-16 DIAGNOSIS — H6122 Impacted cerumen, left ear: Secondary | ICD-10-CM

## 2015-08-16 DIAGNOSIS — Z131 Encounter for screening for diabetes mellitus: Secondary | ICD-10-CM

## 2015-08-16 DIAGNOSIS — H6983 Other specified disorders of Eustachian tube, bilateral: Secondary | ICD-10-CM

## 2015-08-16 LAB — POCT GLYCOSYLATED HEMOGLOBIN (HGB A1C): HEMOGLOBIN A1C: 5.6

## 2015-08-16 NOTE — Progress Notes (Signed)
Patient ID: Bailey FasterAlicia Farra, female   DOB: 08-24-1990, 25 y.o.   MRN: [id number]   Bailey Fasterlicia Proctor, is a 25 y.o. female  [id number]  [id number]  DOB - 08-24-1990  Chief Complaint  Patient presents with  . Sinus Problem        Subjective:   Bailey Hicks is a 25 y.o. female here today for a follow up visit from recent tonsillitis and B ear infection.  She took her last Augmentin this morning.  Her ST has completely resolved.  She still c/o some residual sinus and ear congestion.  She denies f/c. Patient has No headache, No chest pain, No abdominal pain - No Nausea, No new weakness tingling or numbness, No Cough - SOB.   ALLERGIES: No Known Allergies   MEDICATIONS AT HOME: Prior to Admission medications   Not on File     Objective:   Filed Vitals:   08/16/15 0925  BP: 122/77  Pulse: 75  Temp: 97.4 F (36.3 C)  TempSrc: Oral  Resp: 16  Height: 5\' 5"  (1.651 m)  Weight: 220 lb (99.791 kg)  SpO2: 100%    Exam General appearance : Awake, alert, not in any distress. Speech Clear. Not toxic looking HEENT: Atraumatic and Normocephalic, pupils equally reactive to light and accomodation. Tonsil appear normal without erythema.  Uvula is midline.  Turbinates enlarged and boggy. L TM was obstructed by cerumen.  R TM full and congested without infection. Neck: supple, no JVD. No cervical lymphadenopathy.  Chest:Good air entry bilaterally, no added sounds  CVS: S1 S2 regular, no murmurs.  Abdomen: Bowel sounds present, Non tender and not distended with no gaurding, rigidity or rebound. Extremities: B/L Lower Ext shows no edema, both legs are warm to touch Neurology: Awake alert, and oriented X 3, CN II-XII intact, Non focal Skin:No Rash  Data Review Lab Results  Component Value Date   HGBA1C 5.6 08/16/2015   Procedure:  While patient was sitting- L ear was irrigated with warm water and peroxide.  Patient tolerated the procedure well without dizziness or problem.   A large piece of wax was removed and her TM is intact without erythema.  Patient expressed immediately relief and improvement in her hearing.    Assessment & Plan  Recent tonsillitis with ?peritonsillar abscess-NO I&D.  ST has resolved. Antibiotics finished today   Eustachian tube dysfunction, bilateral Residual to recent infection.  Start Flonase, Claritin, and Sudafed  . Cerumen impaction, left Resolved s/p irrigation today  Screening for diabetes mellitus - POCT A1C-normal  Patient have been counseled extensively about nutrition and exercise  Return in about 1 month (around 09/16/2015) for CPE with fasting bloodwork.  The patient was given clear instructions to go to ER or return to medical center if symptoms don't improve, worsen or new problems develop. The patient verbalized understanding. The patient was told to call to get lab results if they haven't heard anything in the next week.     Georgian CoAngela Cloys Vera, PA-C [facility] [place], [place] [phone]   08/16/2015, 9:47 AM

## 2015-08-16 NOTE — Progress Notes (Signed)
Patient's here for hospital f/up for sinus headheads.   Patient denies any pain today. Patient has no further concerns.  Patient requesting a check up.  Patient agrees to A1c screening, but denies flu shot.

## 2015-08-16 NOTE — Patient Instructions (Signed)
Pseudoephedrine tablets What is this medicine? PSEUDOEPHEDRINE (soo doe e FED rin) is a decongestant. It is used to treat congestion of the nose or sinuses. This medicine may be used for other purposes; ask your health care provider or pharmacist if you have questions. What should I tell my health care provider before I take this medicine? They need to know if you have any of the following conditions: -diabetes -glaucoma -heart disease -high blood pressure -kidney disease -prostate trouble -taken an MAOI like Carbex, Eldepryl, Marplan, Nardil, or Parnate in last 14 days -thyroid disease -trouble passing urine -an unusual or allergic reaction to pseudoephedrine, other medicines, foods, dyes, or preservatives -pregnant or trying to get pregnant -breast-feeding How should I use this medicine? Take this medicine by mouth with a glass of water. Follow the directions on the package or prescription label. Take your medicine at regular intervals. Do not take your medicine more often than directed. Talk to your pediatrician regarding the use of this medicine in children. While this drug may be prescribed for children as young as 23 years of age for selected conditions, precautions do apply. Patients over 23 years old may have a stronger reaction and need a smaller dose. Overdosage: If you think you have taken too much of this medicine contact a poison control center or emergency room at once. NOTE: This medicine is only for you. Do not share this medicine with others. What if I miss a dose? If you miss a dose, take it as soon as you can. If it is almost time for your next dose, take only that dose. Do not take double or extra doses. What may interact with this medicine? Do not take this medicine with any of the following medications: -bromocriptine -ergot alkaloids like dihydroergotamine, ergonovine, ergotamine, methylergonovine -MAOIs like Carbex, Eldepryl, Marplan, Nardil, and  Parnate -stimulant medicines for attention disorders, weight loss, or to stay awake This medicine may also interact with the following medications: -alcohol -atropine -bretylium -caffeine -digoxin -linezolid -mecamylamine -medicines for blood pressure -medicines for depression, anxiety, or psychotic disturbances like fluoxetine, sertraline -medicines for enlarged prostate -medicines for sleep -other medicines for cold, cough, or allergy -procarbazine -reserpine -some heart medicines like metoprolol -St. John's Wort This list may not describe all possible interactions. Give your health care provider a list of all the medicines, herbs, non-prescription drugs, or dietary supplements you use. Also tell them if you smoke, drink alcohol, or use illegal drugs. Some items may interact with your medicine. What should I watch for while using this medicine? Tell your doctor or healthcare professional if your symptoms do not start to get better or if they get worse. See your doctor if you are not better in 7 days or if you have a fever. What side effects may I notice from receiving this medicine? Side effects that you should report to your doctor or health care professional as soon as possible: -allergic reactions like skin rash, itching or hives, swelling of the face, lips, or tongue -bloody diarrhea with stomach pain -breathing problems -chest pain -confused, agitated, nervous -fast, irregular heartbeat -feeling faint or lightheaded, falls -hallucinations -high blood pressure -pain, tingling, numbness in the hands or feet -trouble passing urine or change in the amount of urine -trouble sleeping Side effects that usually do not require medical attention (report to your doctor or health care professional if they continue or are bothersome): -headache -loss of appetite -nausea, stomach upset This list may not describe all possible side effects. Call your  doctor for medical advice about  side effects. You may report side effects to FDA at 1-800-FDA-1088. Where should I keep my medicine? Keep out of the reach of children. This medicine may cause accidental overdose and death if taken by other adults, children, or pets. Mix any unused medicine with a substance like cat littler or coffee grounds. Then throw the medicine away in a sealed container like a sealed bag or a coffee can with a lid. Do not use the medicine after the expiration date. Store at room temperature between 15 and 25 degrees C (59 and 77 degrees F). Protect from heat and moisture. NOTE: This sheet is a summary. It may not cover all possible information. If you have questions about this medicine, talk to your doctor, pharmacist, or health care provider.    2016, Elsevier/Gold Standard. (2014-02-03 19:28:08)

## 2015-08-19 ENCOUNTER — Inpatient Hospital Stay: Payer: Self-pay | Admitting: Internal Medicine

## 2015-09-11 ENCOUNTER — Emergency Department (HOSPITAL_BASED_OUTPATIENT_CLINIC_OR_DEPARTMENT_OTHER)
Admission: EM | Admit: 2015-09-11 | Discharge: 2015-09-12 | Disposition: A | Discharge status: Home / Self Care | Payer: Self-pay | Attending: Emergency Medicine | Admitting: Emergency Medicine

## 2015-09-11 ENCOUNTER — Emergency Department (HOSPITAL_BASED_OUTPATIENT_CLINIC_OR_DEPARTMENT_OTHER): Payer: Self-pay

## 2015-09-11 ENCOUNTER — Encounter (HOSPITAL_BASED_OUTPATIENT_CLINIC_OR_DEPARTMENT_OTHER): Payer: Self-pay | Admitting: Emergency Medicine

## 2015-09-11 DIAGNOSIS — R51 Headache: Secondary | ICD-10-CM | POA: Insufficient documentation

## 2015-09-11 DIAGNOSIS — H9203 Otalgia, bilateral: Secondary | ICD-10-CM | POA: Insufficient documentation

## 2015-09-11 DIAGNOSIS — Z3202 Encounter for pregnancy test, result negative: Secondary | ICD-10-CM | POA: Insufficient documentation

## 2015-09-11 DIAGNOSIS — R0981 Nasal congestion: Secondary | ICD-10-CM | POA: Insufficient documentation

## 2015-09-11 DIAGNOSIS — Z8719 Personal history of other diseases of the digestive system: Secondary | ICD-10-CM | POA: Insufficient documentation

## 2015-09-11 DIAGNOSIS — R519 Headache, unspecified: Secondary | ICD-10-CM

## 2015-09-11 LAB — PREGNANCY, URINE: PREG TEST UR: NEGATIVE

## 2015-09-11 MED ORDER — ACETAMINOPHEN 500 MG PO TABS
1000.0000 mg | ORAL_TABLET | Freq: Once | ORAL | Status: AC
  Administered 2015-09-12: 1000 mg via ORAL
  Filled 2015-09-11: qty 2

## 2015-09-11 NOTE — ED Provider Notes (Signed)
CSN: [id number]     Arrival date & time 09/11/15  1958 History  By signing my name below, I, Terrance Branch, attest that this documentation has been prepared under the direction and in the presence of Doug Sou, MD. Electronically Signed: Evon Slack, ED Scribe. 09/11/2015. 10:15 PM.      Chief Complaint  Patient presents with  . Headache    Patient is a 25 y.o. female presenting with headaches. The history is provided by the patient. No language interpreter was used.  Headache Associated symptoms: congestion   Associated symptoms: no fever    HPI Comments: Bailey Hicks is a 25 y.o. female who presents to the Emergency Department complaining of gradual posterior HA onset 1 month. Pt states that her symptoms began after having a peritonsillar abscess. Pt also reports bilateral ear pain. Pt states that her symptoms are worse when layingSupine improved with sitting upright Pt states she it fills like " my head is filled with fluid." Pt states she has tried aleve, Flonase and Claritin with no relief. Pt states that her symptoms feel like previous sinus infection but much worse. She denies fever. Denies tobacco use. Occasional alcohol use. Denies illicit drug use.   History reviewed. No pertinent past medical history. Past Surgical History  Procedure Laterality Date  . Wisdom tooth extraction    . Anterior cruciate ligament repair  Left knee   Family History  Problem Relation Age of Onset  . Hypertension Other    Social History  Substance Use Topics  . Smoking status: Never Smoker   . Smokeless tobacco: Never Used  . Alcohol Use: 0.0 oz/week    0 Standard drinks or equivalent per week     Comment: occ   OB History    Gravida Para Term Preterm AB TAB SAB Ectopic Multiple Living   1 1       0 0      Review of Systems  Constitutional: Negative.  Negative for fever.  HENT: Positive for congestion.   Respiratory: Negative.   Cardiovascular: Negative.    Gastrointestinal: Negative.   Musculoskeletal: Negative.   Skin: Negative.   Neurological: Positive for headaches.  Psychiatric/Behavioral: Negative.   All other systems reviewed and are negative.    Allergies  Review of patient's allergies indicates no known allergies.  Home Medications   Prior to Admission medications   Not on File   BP 134/78 mmHg  Pulse 85  Temp(Src) 98.2 F (36.8 C) (Oral)  Resp 18  Ht  (1.626 m)  Wt 220 lb (99.791 kg)  BMI 37.74 kg/m2  SpO2 100%   Physical Exam  Constitutional: She is oriented to person, place, and time. She appears well-developed and well-nourished.  HENT:  Head: Normocephalic and atraumatic.  Right Ear: External ear normal.  Left Ear: External ear normal.  Nose: Nose normal.  Mouth/Throat: Oropharynx is clear and moist.  Bilateral tympanic membranes normal  Eyes: Conjunctivae are normal. Pupils are equal, round, and reactive to light.  Neck: Neck supple. No tracheal deviation present. No thyromegaly present.  Cardiovascular: Normal rate and regular rhythm.   No murmur heard. Pulmonary/Chest: Effort normal and breath sounds normal.  Abdominal: Soft. Bowel sounds are normal. She exhibits no distension. There is no tenderness.  OBese  Musculoskeletal: Normal range of motion. She exhibits no edema or tenderness.  Neurological: She is alert and oriented to person, place, and time. Coordination normal.  Gait normal Romberg normal pronator drift normal  Skin: Skin is  warm and dry. No rash noted.  Psychiatric: She has a normal mood and affect.  Nursing note and vitals reviewed.   ED Course  Procedures (including critical care time) DIAGNOSTIC STUDIES: Oxygen Saturation is 100% on RA, normal by my interpretation.    COORDINATION OF CARE: 10:14 PM-Discussed treatment plan with pt at bedside and pt agreed to plan.     Labs Review Labs Reviewed - No data to display  Imaging Review No results found.    EKG  Interpretation None     11:55 PM patient resting comfortably. No distress. Results for orders placed or performed during the hospital encounter of 09/11/15  Pregnancy, urine  Result Value Ref Range   Preg Test, Ur NEGATIVE NEGATIVE   Ct Head Wo Contrast  09/11/2015  CLINICAL DATA:  25 year old female with paranasal sinus congestion. EXAM: CT HEAD WITHOUT CONTRAST CT MAXILLOFACIAL WITHOUT CONTRAST TECHNIQUE: Multidetector CT imaging of the head and maxillofacial structures were performed using the standard protocol without intravenous contrast. Multiplanar CT image reconstructions of the maxillofacial structures were also generated. COMPARISON:  None. FINDINGS: CT HEAD FINDINGS The ventricles and the sulci are appropriate in size for the patient's age. There is no intracranial hemorrhage. No midline shift or mass effect identified. The gray-white matter differentiation is preserved. The visualized paranasal sinuses and mastoid air cells are well aerated. The calvarium is intact. CT MAXILLOFACIAL FINDINGS There is no acute facial bone fractures. The maxilla, mandible, and pterygoid plates are intact. The globes, orbits, retro-orbital fat are preserved. The visualized paranasal sinuses are clear. The soft tissues appear unremarkable IMPRESSION: No acute intracranial pathology. No acute facial bone fractures. Clear paranasal sinuses. Electronically Signed   By: Elgie CollardArash  Radparvar M.D.   On: 09/11/2015 23:34   Ct Maxillofacial Wo Cm  09/11/2015  CLINICAL DATA:  25 year old female with paranasal sinus congestion. EXAM: CT HEAD WITHOUT CONTRAST CT MAXILLOFACIAL WITHOUT CONTRAST TECHNIQUE: Multidetector CT imaging of the head and maxillofacial structures were performed using the standard protocol without intravenous contrast. Multiplanar CT image reconstructions of the maxillofacial structures were also generated. COMPARISON:  None. FINDINGS: CT HEAD FINDINGS The ventricles and the sulci are appropriate in size  for the patient's age. There is no intracranial hemorrhage. No midline shift or mass effect identified. The gray-white matter differentiation is preserved. The visualized paranasal sinuses and mastoid air cells are well aerated. The calvarium is intact. CT MAXILLOFACIAL FINDINGS There is no acute facial bone fractures. The maxilla, mandible, and pterygoid plates are intact. The globes, orbits, retro-orbital fat are preserved. The visualized paranasal sinuses are clear. The soft tissues appear unremarkable IMPRESSION: No acute intracranial pathology. No acute facial bone fractures. Clear paranasal sinuses. Electronically Signed   By: Elgie CollardArash  Radparvar M.D.   On: 09/11/2015 23:34    MDM  Plan Tylenol for pain as needed. I'll with [facility] if continuing to have pain week. Final diagnoses:  None   Diagnosis nonspecific headache   I personally performed the services described in this documentation, which was scribed in my presence. The recorded information has been reviewed and considered.      Doug SouSam Devere Brem, MD 09/12/15 0001

## 2015-09-11 NOTE — ED Notes (Signed)
Patient states that she has had a HA x 1 month, the patient reports that she also has ear pain x 1 month. Reports that it is worse at night, and feels like her head is filled with fluid. Denies any N/V

## 2015-09-12 NOTE — Discharge Instructions (Signed)
General Headache Without Cause CT scans of your brain and sinuses were normal. Take Tylenol as directed for pain. You can take Tylenol in addition to Aleve as needed. Follow-up with [facility] if not improved in a week. A headache is pain or discomfort felt around the head or neck area. There are many causes and types of headaches. In some cases, the cause may not be found.  HOME CARE  Managing Pain  Take over-the-counter and prescription medicines only as told by your doctor.  Lie down in a dark, quiet room when you have a headache.  If directed, apply ice to the head and neck area:  Put ice in a plastic bag.  Place a towel between your skin and the bag.  Leave the ice on for 20 minutes, 2-3 times per day.  Use a heating pad or hot shower to apply heat to the head and neck area as told by your doctor.  Keep lights dim if bright lights bother you or make your headaches worse. Eating and Drinking  Eat meals on a regular schedule.  Lessen how much alcohol you drink.  Lessen how much caffeine you drink, or stop drinking caffeine. General Instructions  Keep all follow-up visits as told by your doctor. This is important.  Keep a journal to find out if certain things bring on headaches. For example, write down:  What you eat and drink.  How much sleep you get.  Any change to your diet or medicines.  Relax by getting a massage or doing other relaxing activities.  Lessen stress.  Sit up straight. Do not tighten (tense) your muscles.  Do not use tobacco products. This includes cigarettes, chewing tobacco, or e-cigarettes. If you need help quitting, ask your doctor.  Exercise regularly as told by your doctor.  Get enough sleep. This often means 7-9 hours of sleep. GET HELP IF:  Your symptoms are not helped by medicine.  You have a headache that feels different than the other headaches.  You feel sick to your stomach (nauseous) or you throw  up (vomit).  You have a fever. GET HELP RIGHT AWAY IF:   Your headache becomes really bad.  You keep throwing up.  You have a stiff neck.  You have trouble seeing.  You have trouble speaking.  You have pain in the eye or ear.  Your muscles are weak or you lose muscle control.  You lose your balance or have trouble walking.  You feel like you will pass out (faint) or you pass out.  You have confusion.   This information is not intended to replace advice given to you by your health care provider. Make sure you discuss any questions you have with your health care provider.   Document Released: 03/10/2008 Document Revised: 02/20/2015 Document Reviewed: 09/24/2014 Elsevier Interactive Patient Education [employer]2016 Elsevier Inc.

## 2015-09-16 ENCOUNTER — Emergency Department (HOSPITAL_COMMUNITY)
Admission: EM | Admit: 2015-09-16 | Discharge: 2015-09-16 | Disposition: A | Discharge status: Home / Self Care | Payer: Self-pay | Attending: Emergency Medicine | Admitting: Emergency Medicine

## 2015-09-16 ENCOUNTER — Encounter (HOSPITAL_COMMUNITY): Payer: Self-pay | Admitting: Emergency Medicine

## 2015-09-16 DIAGNOSIS — R519 Headache, unspecified: Secondary | ICD-10-CM

## 2015-09-16 DIAGNOSIS — R51 Headache: Secondary | ICD-10-CM | POA: Insufficient documentation

## 2015-09-16 DIAGNOSIS — M542 Cervicalgia: Secondary | ICD-10-CM | POA: Insufficient documentation

## 2015-09-16 MED ORDER — CYCLOBENZAPRINE HCL 5 MG PO TABS: 5.0000 mg | ORAL_TABLET | Freq: Every day | ORAL | Status: DC

## 2015-09-16 NOTE — ED Notes (Signed)
NP at bedside for pt eval

## 2015-09-16 NOTE — ED Provider Notes (Signed)
CSN: [id number]     Arrival date & time 09/16/15  0356 History   First MD Initiated Contact with Patient 09/16/15 0408     Chief Complaint  Patient presents with  . Headache     (Consider location/radiation/quality/duration/timing/severity/associated sxs/prior Treatment) HPI Comments: Patient states that she's had a headache for 4-5 weeks.  It's unrelenting.  It waxes and wanes in intensity.  She's tried over-the-counter medication without any relief.  She was seen March 29.  For this headache.  She got a head CT and neuro evaluation without any result.  She was told to take Tylenol, ibuprofen.  She returns because now she feels like she is having spasm.  Cannot lay flat without feeling as if her neck is throbbing.  On further questioning, this all started when she started sleeping on the couch after having a peritonsillar abscess.  She's continued sleeping on the couch for some reason and her partner will not sleep in the bed by himself, so has been sleeping on the other end of the couch.  She states she has to curl around him and she uses the arm of the sofa as her pillow.   Patient is a 25 y.o. female presenting with headaches. The history is provided by the patient.  Headache Pain location:  Occipital Quality:  Dull Radiates to:  L neck and R neck Severity currently:  3/10 Severity at highest:  3/10 Onset quality:  Gradual Duration:  4 weeks Timing:  Constant Progression:  Unchanged Chronicity:  New Similar to prior headaches: yes   Associated symptoms: neck pain   Associated symptoms: no congestion, no fever, no myalgias, no neck stiffness and no weakness     History reviewed. No pertinent past medical history. Past Surgical History  Procedure Laterality Date  . Wisdom tooth extraction    . Anterior cruciate ligament repair  Left knee   Family History  Problem Relation Age of Onset  . Hypertension Other    Social History  Substance Use Topics  . Smoking status: Never  Smoker   . Smokeless tobacco: Never Used  . Alcohol Use: 0.0 oz/week    0 Standard drinks or equivalent per week     Comment: occ   OB History    Gravida Para Term Preterm AB TAB SAB Ectopic Multiple Living   1 1       0 0     Review of Systems  Constitutional: Negative for fever.  HENT: Negative for congestion.   Musculoskeletal: Positive for neck pain. Negative for myalgias and neck stiffness.  Neurological: Positive for headaches. Negative for weakness.  All other systems reviewed and are negative.     Allergies  Review of patient's allergies indicates no known allergies.  Home Medications   Prior to Admission medications   Medication Sig Start Date End Date Taking? Authorizing Provider  cyclobenzaprine (FLEXERIL) 5 MG tablet Take 1 tablet (5 mg total) by mouth at bedtime. 09/16/15   Earley Favor, NP   BP 133/87 mmHg  Pulse 70  Temp(Src) 97.8 F (36.6 C) (Oral)  Resp 16  SpO2 100%  LMP 09/05/2015 Physical Exam  Constitutional: She appears well-developed and well-nourished.  HENT:  Head: Normocephalic.  Eyes: Pupils are equal, round, and reactive to light.  Neck: Normal range of motion. Muscular tenderness present. No spinous process tenderness present. No edema and normal range of motion present.  Cardiovascular: Normal rate.   Pulmonary/Chest: Effort normal.  Abdominal: Soft.  Musculoskeletal: Normal range of motion.  Neurological: She is alert.  Skin: Skin is warm.  Nursing note and vitals reviewed.   ED Course  Procedures (including critical care time) Labs Review Labs Reviewed - No data to display  Imaging Review No results found. I have personally reviewed and evaluated these images and lab results as part of my medical decision-making.   EKG Interpretation None    After listening to the patient's complaint and history of file her headache is due to the position she is sleeping.  This has been discussed with her that she needs to go back to her bed  to sleep flat.  She's been unable to stretch out and get a good night sleep.  I've also suggested she roll.  A small washcloth or hand towel behind her neck for extra support.  I will prescribe a muscle relaxer for the next several nights for her to use and recommend she follow-up with primary care physician if she continues to have problems, but I am pretty competent that this will soften her problem  MDM   Final diagnoses:  Neck pain  Nonintractable headache, unspecified chronicity pattern, unspecified headache type         Earley FavorGail Anette Barra, NP 09/16/15 [id number]  Lorre NickAnthony Allen, MD 09/17/15 [phone]

## 2015-09-16 NOTE — ED Notes (Signed)
Pt c/o HA and neck pain, seen on 3/29, feels like the same pain.

## 2015-09-16 NOTE — Discharge Instructions (Signed)
As discussed.  I think most of your head and neck pain/spasm is from the position your sleeping and using the arm above your sofa as a pillow.  I suggest that she go back to your bad sleep with a pillow and stretch out you can also place a small rolled towel under your neck for extra support.  You've also been given a prescription for medication called Flexeril, which is a muscle relaxer.  Please take this only at night.  You've also been given a referral for primary care physician

## 2015-09-20 ENCOUNTER — Emergency Department (HOSPITAL_BASED_OUTPATIENT_CLINIC_OR_DEPARTMENT_OTHER)
Admission: EM | Admit: 2015-09-20 | Discharge: 2015-09-20 | Disposition: A | Discharge status: Home / Self Care | Payer: Self-pay | Attending: Emergency Medicine | Admitting: Emergency Medicine

## 2015-09-20 ENCOUNTER — Encounter (HOSPITAL_BASED_OUTPATIENT_CLINIC_OR_DEPARTMENT_OTHER): Payer: Self-pay | Admitting: *Deleted

## 2015-09-20 DIAGNOSIS — F418 Other specified anxiety disorders: Secondary | ICD-10-CM

## 2015-09-20 DIAGNOSIS — R202 Paresthesia of skin: Secondary | ICD-10-CM | POA: Insufficient documentation

## 2015-09-20 DIAGNOSIS — F419 Anxiety disorder, unspecified: Secondary | ICD-10-CM | POA: Insufficient documentation

## 2015-09-20 MED ORDER — DIAZEPAM 5 MG PO TABS
5.0000 mg | ORAL_TABLET | Freq: Two times a day (BID) | ORAL | Status: DC | PRN
Start: 2015-09-20 — End: 2015-11-20

## 2015-09-20 NOTE — ED Notes (Addendum)
Neck and back pain. States her hands and feet have been falling asleep. Worse at night. She was seen for same a few days ago and had a negative work up.

## 2015-09-20 NOTE — Discharge Instructions (Signed)
Substance Abuse Treatment Programs ° °Intensive Outpatient Programs °[facility]     °[address]      °[place], [place]                   °[phone]      ° °[facility] °[address] °[place], [place] °[phone] ° °[facility]     °(Inpatient and outpatient)     °[address]           °[phone]   ° °[facility] °[phone] (Suboxone and Methadone) ° °[address]      °[place], [place] [address]      °[phone]      ° °[address] °[place], [place] °852-3033 ° °[facility] (Outpatient/Inpatient, Chemical)    °(insurance only) [phone]      °       °[facility] (Groups & Residential) °[place], [place] °[phone] ° °   °[facility]     °[address]     °[place], [place]      °[phone]      ° °[facility] (for caregivers and family) °[address] °[place], [place] °336-299-4655 ° ° ° ° ° °Residential Treatment Programs °[facility]      °[address], [place], [place] [address]  °[phone]      ° °[facility] °[address] [place], [place] [address] °[phone] ° °[facility]        °[phone]      ° °[facility] °[phone] ° °[facility]             °[address]                                         °[place], [place]                                                °[phone] or [phone]                              ° °[facility] [place] °[address] °[place], [address] °[phone] ° °[facility]    °[address]      °, [place]     °[phone]      ° °[facility] °[address] °, [place] °[phone] ° °[facility]   °[address]     °[place], [place] [address]     °[phone]      °Admissions: 8am-3pm M-F ° °[facility] °[address] °[place],  [place] °[phone] ° °[facility]: Residential Program (90 Days)   °[place], [place]      °336-725-8389 or [phone]    ° °[facility] °[place], [place] °(Walk in Hours over the weekend or by referral) ° °[facility] °[address], [place], [place] [address] °(336) [phone] ° °Crisis Mobile: [facility]:  1-877-626-1772 (for crisis response 24 hours a day) °[facility] Hotline:      1-800-256-2452 °Outpatient Psychiatry and Counseling ° °[facility]: Mobile Crisis   Management 24 hours:  1-579-867-5796  [facility] of the [employer] sliding scale fee and walk in schedule: M-F 8am-12pm/1pm-3pm [address]  [place], [place] [address] [place], [place] [address] [phone]  [facility] ([facility])- new patient walk-in appointments available Monday - Friday 8am -3pm.          [address] [place], [place] [address] [phone] or crisis line- [facility]/ Intensive Outpatient Therapy Program [place], [place] [address] [place]      [phone] [place] [place], [place] [address]                 [place]   [facility] [phone] [place], [place] [address]   [employer] [facility]          [address] Johnette Abraham  [place], [place] [address]       [phone]  [facility] [address] [place], [place] [address] [phone]  [facility]    [address] [place], [place] [address]     [place], [place] Joycelyn Man     [place] [address]     [phone]       [facility] [place] [address]  [facility]     [address], [place], [place], [place] [address] [place]     [address]     [place], [place] [address]     [phone]       [facility] [address] [place], [place] [address] [phone] Resources for Temporary Residential Assistance/Crisis [facility]) M-F 8am-3pm   [address] [place], [place] [address]   [phone] Services include: laundry, barbering, support groups, case management, phone  & computer access, showers, AA/NA mtgs, mental health/substance abuse nurse, job skills class, disability information, VA assistance, spiritual classes, etc.   [facility]   [address], [place]     [place]              [employer] (women and children)       [address]. [place], [place] [address] [phone] [email or web address] for application and process Application Required  [facility]   [address]    [place] [address]     [phone]                    [place],  [address] [phone](OILNZVJK application appt.) Application Required  [facility] (women only)    [address]     [place],  [address]     [phone]  Intake starts 6pm daily Need valid ID, SSC, & Police report [facility] [place] [address] [place], [place] [phone] Application Required  [facility] (men only)     [address]      [place], [place]     [phone]       [facility] [place] (Pregnant women only) [address] [place], [place] [phone]  [facility]      [address]      [place], [place] [address]     [phone]             [facility] [address] [place], [place] [phone] 90 day commitment/SA/Application process  [facility](men only)     [address]     [place], [place]     [phone]       Check-in at [facility] [place]            [facility] [address] [place], [place] [address] [phone] Men/Women/Women and Children must be there by 7 pm  [facility] [place], [place] [phone]                Generalized Anxiety Disorder Generalized anxiety disorder (GAD) is a mental disorder. It interferes with life functions, including relationships, work, and school. GAD is different from normal anxiety, which everyone experiences at some point in their lives in response to specific life events and activities. Normal anxiety actually helps us prepare for and get through these life events and activities. Normal anxiety goes away after the event or activity is over.  GAD causes anxiety that is not necessarily related to specific events or activities. It also causes excess anxiety in proportion to specific events or activities. The anxiety associated with GAD is also difficult to control. GAD can vary from mild to severe. People with severe GAD can have intense waves of anxiety with physical symptoms (panic attacks).  SYMPTOMS The anxiety and worry associated with GAD are difficult to control. This anxiety and worry are related to many life events and activities and also occur more days than not for 6 months or longer. People with GAD also have three or more of the following symptoms (one or more in children):  Restlessness.   Fatigue.  Difficulty concentrating.   Irritability.  Muscle tension.  Difficulty sleeping or unsatisfying sleep. DIAGNOSIS GAD is diagnosed through an assessment by your health care provider. Your health care provider will ask  you questions aboutyour mood,physical symptoms, and events in your life. Your health care provider may ask you about your medical history and use of alcohol or drugs, including prescription medicines. Your health care provider may also do a physical exam and blood tests. Certain medical conditions and the use of certain substances can cause symptoms similar to those associated with GAD. Your health care provider may refer you to a mental health specialist for further evaluation. TREATMENT The following therapies are usually used to treat GAD:   Medication. Antidepressant medication usually is prescribed for long-term daily control. Antianxiety medicines may be added in severe cases, especially when panic attacks occur.   Talk therapy (psychotherapy). Certain types of talk therapy can be helpful in treating GAD by providing support, education, and guidance. A form of talk therapy called cognitive behavioral therapy can teach you healthy ways to think about and react to daily life events and activities.  Stress managementtechniques. These include yoga,  meditation, and exercise and can be very helpful when they are practiced regularly. A mental health specialist can help determine which treatment is best for you. Some people see improvement with one therapy. However, other people require a combination of therapies.   This information is not intended to replace advice given to you by your health care provider. Make sure you discuss any questions you have with your health care provider.   Document Released: 09/26/2012 Document Revised: 06/22/2014 Document Reviewed: 09/26/2012 Elsevier Interactive Patient Education 2016 Elsevier Inc. Paresthesia Paresthesia is a burning or prickling feeling. This feeling can happen in any part of the body. It often happens in the hands, arms, legs, or feet. Usually, it is not painful. In most cases, the feeling goes away in a short time and is not a sign of a serious  problem. HOME CARE  Avoid drinking alcohol.  Try massage or needle therapy (acupuncture) to help with your problems.  Keep all follow-up visits as told by your doctor. This is important. GET HELP IF:  You keep on having episodes of paresthesia.  Your burning or prickling feeling gets worse when you walk.  You have pain or cramps.  You feel dizzy.  You have a rash. GET HELP RIGHT AWAY IF:  You feel weak.  You have trouble walking or moving.  You have problems speaking, understanding, or seeing.  You feel confused.  You cannot control when you pee (urinate) or poop (bowel movement).  You lose feeling (numbness) after an injury.  You pass out (faint).   This information is not intended to replace advice given to you by your health care provider. Make sure you discuss any questions you have with your health care provider.   Document Released: 05/14/2008 Document Revised: 10/16/2014 Document Reviewed: 05/28/2014 Elsevier Interactive Patient Education [employer]

## 2015-09-20 NOTE — ED Provider Notes (Signed)
CSN: [id number]     Arrival date & time 09/20/15  1924 History   First MD Initiated Contact with Patient 09/20/15 2038     Chief Complaint  Patient presents with  . Neck Pain     (Consider location/radiation/quality/duration/timing/severity/associated sxs/prior Treatment) HPI Comments: Patient with ongoing paresthesias of legs and arms, along with spasmodic cervical muscle pain. No history of prior neck injury. Not diabetic, no known family history of diabetes. Patient does endorse increased stress and mild anxiety.  Patient is a 25 y.o. female presenting with neck pain. The history is provided by medical records. No language interpreter was used.  Neck Pain Pain location:  Generalized neck Quality:  Cramping Pain radiates to:  L shoulder and R shoulder Pain severity:  Mild Pain is:  Worse during the night Onset quality:  Gradual Timing:  Intermittent Progression:  Waxing and waning Chronicity:  Recurrent Context: not recent injury   Ineffective treatments:  Muscle relaxants Associated symptoms: tingling   Associated symptoms: no bladder incontinence, no bowel incontinence, no chest pain, no photophobia and no weakness     History reviewed. No pertinent past medical history. Past Surgical History  Procedure Laterality Date  . Wisdom tooth extraction    . Anterior cruciate ligament repair  Left knee   Family History  Problem Relation Age of Onset  . Hypertension Other    Social History  Substance Use Topics  . Smoking status: Never Smoker   . Smokeless tobacco: Never Used  . Alcohol Use: 0.0 oz/week    0 Standard drinks or equivalent per week     Comment: occ   OB History    Gravida Para Term Preterm AB TAB SAB Ectopic Multiple Living   1 1       0 0     Review of Systems  Eyes: Negative for photophobia.  Cardiovascular: Negative for chest pain.  Gastrointestinal: Negative for bowel incontinence.  Genitourinary: Negative for bladder incontinence.   Musculoskeletal: Positive for neck pain.  Neurological: Positive for tingling. Negative for dizziness, facial asymmetry, speech difficulty and weakness.  All other systems reviewed and are negative.     Allergies  Review of patient's allergies indicates no known allergies.  Home Medications   Prior to Admission medications   Medication Sig Start Date End Date Taking? Authorizing Provider  cyclobenzaprine (FLEXERIL) 5 MG tablet Take 1 tablet (5 mg total) by mouth at bedtime. 09/16/15   Earley FavorGail Schulz, NP  naproxen sodium (ANAPROX) 220 MG tablet Take 220 mg by mouth 2 (two) times daily with a meal.    Historical Provider, MD   BP 153/88 mmHg  Pulse 88  Temp(Src) 98.3 F (36.8 C) (Oral)  Resp 18  Ht 5\' 4"  (1.626 m)  Wt 99.791 kg  BMI 37.74 kg/m2  SpO2 100%  LMP 09/05/2015 Physical Exam  Constitutional: She is oriented to person, place, and time. She appears well-developed and well-nourished.  HENT:  Head: Normocephalic.  Eyes: Pupils are equal, round, and reactive to light.  Neck: Neck supple.  Cardiovascular: Normal rate and regular rhythm.   Pulmonary/Chest: Effort normal and breath sounds normal.  Abdominal: Soft. Bowel sounds are normal.  Lymphadenopathy:    She has no cervical adenopathy.  Neurological: She is alert and oriented to person, place, and time. She has normal strength. No cranial nerve deficit or sensory deficit. She displays a negative Romberg sign. Coordination normal. GCS eye subscore is 4. GCS verbal subscore is 5. GCS motor subscore is 6.  Skin: Skin is warm and dry.  Psychiatric: She has a normal mood and affect.  Nursing note and vitals reviewed.   ED Course  Procedures (including critical care time) Labs Review Labs Reviewed - No data to display  Imaging Review No results found. I have personally reviewed and evaluated these images and lab results as part of my medical decision-making.   EKG Interpretation None     Patient with recurring  paresthesia of hands and feet. Neuro exam normal today. No history of neck injury. No cervical spinal tenderness on exam.   Symptoms seem to have an anxiety component, and patient admits to increasing stress and concerns about her health.  MDM   Final diagnoses:  None  Paresthesias. Anxiety. Cervical muscle spasm-will trial valium for muscle relaxant and anxiolytic properties. Care instructions provided. Return precautions discussed. Follow-up with PCP.          Felicie Morn, NP 09/21/15 0221  Marily Memos, MD 09/21/15 [phone]

## 2015-11-20 ENCOUNTER — Emergency Department (HOSPITAL_COMMUNITY): Payer: Self-pay

## 2015-11-20 ENCOUNTER — Encounter (HOSPITAL_COMMUNITY): Payer: Self-pay | Admitting: Oncology

## 2015-11-20 ENCOUNTER — Emergency Department (HOSPITAL_COMMUNITY)
Admission: EM | Admit: 2015-11-20 | Discharge: 2015-11-20 | Disposition: A | Discharge status: Home / Self Care | Payer: Self-pay | Attending: Emergency Medicine | Admitting: Emergency Medicine

## 2015-11-20 DIAGNOSIS — R202 Paresthesia of skin: Secondary | ICD-10-CM | POA: Insufficient documentation

## 2015-11-20 MED ORDER — DIAZEPAM 5 MG PO TABS: 5.0000 mg | ORAL_TABLET | Freq: Every evening | ORAL | Status: DC | PRN

## 2015-11-20 NOTE — Discharge Instructions (Signed)
1. Medications: Take valium as needed at night for muscle spasms, ibuprofen or tylenol for inflammation, continue usual home medications 2. Treatment: rest, drink plenty of fluids, ice affected area as needed 3. Follow Up: Please follow up with your primary doctor in the next 2 weeks for discussion of your diagnoses and further evaluation after today's visit; Please return to the ER for new or worsening symptoms, any additional concerns.

## 2015-11-20 NOTE — ED Notes (Signed)
Pt c/o numbness and tingling to hands, feet, neck x 2 months. C/o headaches intermittently.   Also c/o intermittent swelling to left side of face x 2 months.Pt also c/o muscles spasms.   Pt states she has not seen PCP.  Has presented to the ED where she reports valium was rx d/t anxiety.    Pt states, "I believe it is some kind of nerve damage."  Pt rates pain 5/10, generalized.

## 2015-11-20 NOTE — Progress Notes (Signed)
Patient noted to have been seen in the Ed 12 times within the last six months.  This EDCM spoke to patient in February of this year and [facility] and financial counselor for orange card enrollment.  [facility]  [facility].

## 2015-11-20 NOTE — ED Provider Notes (Signed)
CSN: [id number]     Arrival date & time 11/20/15  1906 History   First MD Initiated Contact with Patient 11/20/15 2001     Chief Complaint  Patient presents with  . Multiple c/o     (Consider location/radiation/quality/duration/timing/severity/associated sxs/prior Treatment) The history is provided by the patient and medical records. No language interpreter was used.    Bailey Hicks is a 25 y.o. female  who presents to the Emergency Department complaining of occipital headache intermittently over the last 3 months. Associated symptoms include intermittent numbness and tingling to bilateral upper extremities, neck pain, and difficulty sleeping. She states at symptom onset 3 months ago, she had been sleeping in a chair for a few nights and noticed neck discomfort which she believes was from position. Symptoms worse at night - she states that she works two jobs during the day and is too busy to notice her symptoms. When she gets home at night and has time to relax, she begins noticing symptoms again. No alleviating factors noted. She was seen in ED 3/29 for symptoms where she had a negative CT head and maxillofacial. Seen again for sxs on 4/03 where she was given flexeril for likely muscle strain and states that did not help whatsoever. Then seen for same sxs on 4/07 where she was given valium which helps her sleep at night, but she is unsure if it improves symptoms. Denies visual changes, slurred speech, gait abnormalities, chest pain, n/v, sob, fever. Has not followed up with a PCP for symptoms - called [facility] but did not follow up because they could not find a date that fit in with her work schedule.    History reviewed. No pertinent past medical history. Past Surgical History  Procedure Laterality Date  . Wisdom tooth extraction    . Anterior cruciate ligament repair  Left knee   Family History  Problem Relation Age of Onset  . Hypertension Other    Social  History  Substance Use Topics  . Smoking status: Never Smoker   . Smokeless tobacco: Never Used  . Alcohol Use: 0.0 oz/week    0 Standard drinks or equivalent per week     Comment: occ   OB History    Gravida Para Term Preterm AB TAB SAB Ectopic Multiple Living   1 1       0 0     Review of Systems  Constitutional: Negative for fever and chills.  HENT: Negative for congestion.   Eyes: Negative for visual disturbance.  Respiratory: Negative for cough and shortness of breath.   Cardiovascular: Negative.   Gastrointestinal: Negative for nausea, vomiting and abdominal pain.  Genitourinary: Negative for dysuria.  Musculoskeletal: Positive for neck pain. Negative for back pain and gait problem.  Skin: Negative for rash.  Neurological: Positive for headaches. Negative for dizziness, syncope, speech difficulty and weakness.      Allergies  Review of patient's allergies indicates no known allergies.  Home Medications   Prior to Admission medications   Medication Sig Start Date End Date Taking? Authorizing Provider  cyclobenzaprine (FLEXERIL) 5 MG tablet Take 1 tablet (5 mg total) by mouth at bedtime. Patient not taking: Reported on 11/20/2015 09/16/15   Earley Favor, NP  diazepam (VALIUM) 5 MG tablet Take 1 tablet (5 mg total) by mouth at bedtime as needed for anxiety. 11/20/15   Jaime Pilcher Ward, PA-C   BP 117/72 mmHg  Pulse 83  Temp(Src) 98.4 F (36.9 C) (Oral)  Resp 18  SpO2 99%  LMP 11/14/2015 (Exact Date) Physical Exam  Constitutional: She is oriented to person, place, and time. She appears well-developed and well-nourished.  Alert and in no acute distress  HENT:  Head: Normocephalic and atraumatic.  Mouth/Throat: Oropharynx is clear and moist.  Neck: Normal range of motion.  TTP of paraspinal musculature. Full ROM without pain. No meningeal signs.  Cardiovascular: Normal rate, regular rhythm, normal heart sounds and intact distal pulses.  Exam reveals no gallop and no  friction rub.   No murmur heard. Pulmonary/Chest: Effort normal and breath sounds normal. No respiratory distress. She has no wheezes. She has no rales. She exhibits no tenderness.  Abdominal: Soft. She exhibits no distension. There is no tenderness.  Musculoskeletal: She exhibits no edema.  Neurological: She is alert and oriented to person, place, and time.  Alert, oriented, thought content appropriate, able to give a coherent history. Speech is clear and goal oriented, able to follow commands.  Cranial Nerves:  II:  Peripheral visual fields grossly normal, pupils equal, round, reactive to light III, IV, VI: EOM intact bilaterally, ptosis not present V,VII: smile symmetric, eyes kept closed tightly against resistance, facial light touch sensation equal VIII: hearing grossly normal IX, X: symmetric soft palate movement, uvula elevates symmetrically  XI: bilateral shoulder shrug symmetric and strong XII: midline tongue extension 5/5 muscle strength in upper and lower extremities bilaterally including strong and equal grip strength and dorsiflexion/plantar flexion Sensory to light touch normal in all four extremities.  Normal finger-to-nose and rapid alternating movements; normal gait and balance. Negative romberg, no pronator drift.  Skin: Skin is warm and dry.  Nursing note and vitals reviewed.   ED Course  Procedures (including critical care time) Labs Review Labs Reviewed - No data to display  Imaging Review Dg Cervical Spine Complete  11/20/2015  CLINICAL DATA:  Neck pain for 2 months, no known injury, initial encounter EXAM: CERVICAL SPINE - COMPLETE 4+ VIEW COMPARISON:  09/11/2015 FINDINGS: There is no evidence of cervical spine fracture or prevertebral soft tissue swelling. Alignment is normal. No other significant bone abnormalities are identified. IMPRESSION: No acute abnormality noted. Electronically Signed   By: Alcide CleverMark  Lukens M.D.   On: 11/20/2015 21:06   I have personally  reviewed and evaluated these images and lab results as part of my medical decision-making.   EKG Interpretation None      MDM   Final diagnoses:  Paresthesias   Bailey Hicks presents to ED for bilateral neck pain and bilateral UE paresthesias over the last 2-3 months. Seen on 3/29 with negative CT head and maxillofacial. Also seen on 4/03 and 4/07 for similar symptoms. PCP follow-up was recommended at all visits - has yet to follow up. This point was again stressed today. No focal neuro deficits on exam. Likely secondary to neck strain/cervical radiculopathy. X-ray of C-spine unremarkable. Symptomatic home care instructions given. PCP follow up again strongly encouraged. Return precautions given and all questions answered.   [facility] Hospital[facility]ime Pilcher Ward, PA-C 11/20/15 2138  Rolland PorterMark James, MD 12/06/15 [phone]

## 2015-11-20 NOTE — ED Notes (Signed)
Pt reports understanding of discharge information. No questions at time of discharge 

## 2016-01-21 ENCOUNTER — Ambulatory Visit: Payer: Self-pay | Attending: Internal Medicine | Admitting: Internal Medicine

## 2016-01-21 ENCOUNTER — Encounter: Payer: Self-pay | Admitting: Internal Medicine

## 2016-01-21 VITALS — BP 123/83 | HR 72 | Temp 98.1°F | Resp 16 | Wt 226.8 lb

## 2016-01-21 DIAGNOSIS — D649 Anemia, unspecified: Secondary | ICD-10-CM

## 2016-01-21 DIAGNOSIS — M542 Cervicalgia: Secondary | ICD-10-CM

## 2016-01-21 DIAGNOSIS — G629 Polyneuropathy, unspecified: Secondary | ICD-10-CM

## 2016-01-21 LAB — FOLATE: FOLATE: 10.4 ng/mL (ref >5.4)

## 2016-01-21 LAB — T4, FREE: Free T4: 1.3 ng/dL (ref 0.8–1.8)

## 2016-01-21 LAB — IRON,TIBC AND FERRITIN PANEL
%SAT: 16 % (ref 11–50)
Ferritin: 37 ng/mL (ref 10–154)
Iron: 56 ug/dL (ref 40–190)
TIBC: 345 ug/dL (ref 250–450)

## 2016-01-21 LAB — VITAMIN B12: VITAMIN B 12: 502 pg/mL (ref 200–1100)

## 2016-01-21 LAB — TSH: TSH: 0.33 mIU/L — ABNORMAL LOW

## 2016-01-21 MED ORDER — DICLOFENAC SODIUM 1 % TD GEL: 2.0000 g | Freq: Four times a day (QID) | TRANSDERMAL | 2 refills | Status: DC

## 2016-01-21 NOTE — Progress Notes (Signed)
Bailey Hicks, is a 25 y.o. female  NWG:[id number]  VHQ:[id number]  DOB - 1990/12/05  CC:  Chief Complaint  Patient presents with  . Establish Care       HPI: Bailey Hicks is a 25 y.o. female here today to establish medical care, last seen in clinic 3/17, since than has been to ED 4 times for ha/neck pains/parathesias.  Pt c/o of parathesias to bilat arms /legs at times, the "quickly fall asleep".  Also c/o of neck pains, which can cause ha as well.  No ha currently.    Does not smoke, drinks occasional etoh.  She is sexually active, not married, no children.  Still has menses.  Patient has No headache, No chest pain, No abdominal pain - No Nausea, No new weakness tingling or numbness, No Cough - SOB.    Ros: Per HPI, o/w all systems reviewed and negative.   No Known Allergies No past medical history on file. No current outpatient prescriptions on file prior to visit.   No current facility-administered medications on file prior to visit.    Family History  Problem Relation Age of Onset  . Hypertension Other    Social History   Social History  . Marital status: Single    Spouse name: N/A  . Number of children: N/A  . Years of education: N/A   Occupational History  . Not on file.   Social History Main Topics  . Smoking status: Never Smoker  . Smokeless tobacco: Never Used  . Alcohol use 0.0 oz/week     Comment: occ  . Drug use: No  . Sexual activity: Yes    Partners: Male    Birth control/ protection: None   Other Topics Concern  . Not on file   Social History Narrative  . No narrative on file    Objective:   Vitals:   01/21/16 1031  BP: 123/83  Pulse: 72  Resp: 16  Temp: 98.1 F (36.7 C)    Filed Weights   01/21/16 1031  Weight: 226 lb 12.8 oz (102.9 kg)    BP Readings from Last 3 Encounters:  01/21/16 123/83  11/20/15 117/72  09/20/15 121/71    Physical Exam: Constitutional: Patient appears well-developed and  well-nourished. No distress. AAOx3, obese. HENT: Normocephalic, atraumatic, External right and left ear normal. Oropharynx is clear and moist. bilat TMs clear, thick neck w/ fullness,  Eyes: Conjunctivae and EOM are normal. PERRL, no scleral icterus.  ?exothalmoplegia Neck: Normal ROM.  Thick neck. Neck supple. No JVD. No tracheal deviation. ?possilbe thyromegaly on palpation, difficult exam due to neck fullness, no thyroid bruits noted. CVS: RRR, S1/S2 +, no murmurs, no gallops, no carotid bruit.  Pulmonary: Effort and breath sounds normal, no stridor, rhonchi, wheezes, rales.  Abdominal: Soft. BS +, no distension, tenderness, rebound or guarding.  Musculoskeletal: Normal range of motion. No edema and no tenderness.  LE: bilat/ no c/c/e, pulses 2+ bilateral. Neuro: Alert. 2+ reflexes bilat knees, neg clonus of feet,  muscle tone coordination wnl. No cranial nerve deficit grossly. Skin: Skin is warm and dry. No rash noted. Not diaphoretic. No erythema. No pallor. Psychiatric: Normal mood and affect. Behavior, judgment, thought content normal.  Lab Results  Component Value Date   WBC 8.7 08/02/2015   HGB 11.7 (L) 08/02/2015   HCT 35.8 (L) 08/02/2015   MCV 87.7 08/02/2015   PLT 253 08/02/2015   Lab Results  Component Value Date   CREATININE 0.90 08/02/2015   BUN  16 08/02/2015   NA 136 08/02/2015   K 3.6 08/02/2015   CL 103 08/02/2015   CO2 25 08/02/2015    Lab Results  Component Value Date   HGBA1C 5.6 08/16/2015   Lipid Panel  No results found for: CHOL, TRIG, HDL, CHOLHDL, VLDL, LDLCALC     Depression screen [facility] 2/9 01/21/2016 08/16/2015 05/18/2014 03/01/2014  Decreased Interest 1 0 0 1  Down, Depressed, Hopeless 0 0 0 0  PHQ - 2 Score 1 0 0 1   Dc cerv spine 11/20/15 FINDINGS: There is no evidence of cervical spine fracture or prevertebral soft tissue swelling. Alignment is normal. No other significant bone abnormalities are identified.  IMPRESSION: No acute abnormality  noted.   09/11/15 ct max IMPRESSION: No acute intracranial pathology.  No acute facial bone fractures.  Clear paranasal sinuses.   Electronically Signed   By: Elgie CollardArash  Radparvar M.D. Assessment and plan:   1. Neck pain /cervicalgia Trial voltaern gel Recd neck stretching exercises/warm heat.  2. Neuropathy (HCC) /parathesias all 4 extrem., hx of abnml tsh,  - not consistent with just cervicalgia. - TSH - T4, Free - RPR - HIV antibody (with reflex) - chk b12/folate  3. Anemia, unspecified anemia type - Iron, TIBC and Ferritin Panel - Vitamin B12 - Folate  4. Financial assistance  Return in about 4 weeks (around 02/18/2016) for nerve pains/neck pain.  The patient was given clear instructions to go to ER or return to medical center if symptoms don't improve, worsen or new problems develop. The patient verbalized understanding. The patient was told to call to get lab results if they haven't heard anything in the next week.    This note has been created with Education officer, environmentalDragon speech recognition software and smart phrase technology. Any transcriptional errors are unintentional.   Pete Glatterawn T Tanasia Budzinski, MD, MBA/MHA [facility] [place], [place] [phone]   01/21/2016, 11:25 AM

## 2016-01-21 NOTE — Patient Instructions (Signed)
Financial aid packet  Neuropathic Pain Neuropathic pain is pain caused by damage to the nerves that are responsible for certain sensations in your body (sensory nerves). The pain can be caused by damage to:   The sensory nerves that send signals to your spinal cord and brain (peripheral nervous system).  The sensory nerves in your brain or spinal cord (central nervous system). Neuropathic pain can make you more sensitive to pain. What would be a minor sensation for most people may feel very painful if you have neuropathic pain. This is usually a long-term condition that can be difficult to treat. The type of pain can differ from person to person. It may start suddenly (acute), or it may develop slowly and last for a long time (chronic). Neuropathic pain may come and go as damaged nerves heal or may stay at the same level for years. It often causes emotional distress, loss of sleep, and a lower quality of life. CAUSES  The most common cause of damage to a sensory nerve is diabetes. Many other diseases and conditions can also cause neuropathic pain. Causes of neuropathic pain can be classified as:  Toxic. Many drugs and chemicals can cause toxic damage. The most common cause of toxic neuropathic pain is damage from drug treatment for cancer (chemotherapy).  Metabolic. This type of pain can happen when a disease causes imbalances that damage nerves. Diabetes is the most common of these diseases. Vitamin B deficiency caused by long-term alcohol abuse is another common cause.  Traumatic. Any injury that cuts, crushes, or stretches a nerve can cause damage and pain. A common example is feeling pain after losing an arm or leg (phantom limb pain).  Compression-related. If a sensory nerve gets trapped or compressed for a long period of time, the blood supply to the nerve can be cut off.  Vascular. Many blood vessel diseases can cause neuropathic pain by decreasing blood supply and oxygen to  nerves.  Autoimmune. This type of pain results from diseases in which the body's defense system mistakenly attacks sensory nerves. Examples of autoimmune diseases that can cause neuropathic pain include lupus and multiple sclerosis.  Infectious. Many types of viral infections can damage sensory nerves and cause pain. Shingles infection is a common cause of this type of pain.  Inherited. Neuropathic pain can be a symptom of many diseases that are passed down through families (genetic). SIGNS AND SYMPTOMS  The main symptom is pain. Neuropathic pain is often described as:  Burning.  Shock-like.  Stinging.  Hot or cold.  Itching. DIAGNOSIS  No single test can diagnose neuropathic pain. Your health care provider will do a physical exam and ask you about your pain. You may use a pain scale to describe how bad your pain is. You may also have tests to see if you have a high sensitivity to pain and to help find the cause and location of any sensory nerve damage. These tests may include:  Imaging studies, such as:  X-rays.  CT scan.  MRI.  Nerve conduction studies to test how well nerve signals travel through your sensory nerves (electrodiagnostic testing).  Stimulating your sensory nerves through electrodes on your skin and measuring the response in your spinal cord and brain (somatosensory evoked potentials). TREATMENT  Treatment for neuropathic pain may change over time. You may need to try different treatment options or a combination of treatments. Some options include:  Over-the-counter pain relievers.  Prescription medicines. Some medicines used to treat other conditions may also  help neuropathic pain. These include medicines to:  Control seizures (anticonvulsants).  Relieve depression (antidepressants).  Prescription-strength pain relievers (narcotics). These are usually used when other pain relievers do not help.  Transcutaneous nerve stimulation (TENS). This uses  electrical currents to block painful nerve signals. The treatment is painless.  Topical and local anesthetics. These are medicines that numb the nerves. They can be injected as a nerve block or applied to the skin.  Alternative treatments, such as:  Acupuncture.  Meditation.  Massage.  Physical therapy.  Pain management programs.  Counseling. HOME CARE INSTRUCTIONS  Learn as much as you can about your condition.  Take medicines only as directed by your health care provider.  Work closely with all your health care providers to find what works best for you.  Have a good support system at home.  Consider joining a chronic pain support group. SEEK MEDICAL CARE IF:  Your pain treatments are not helping.  You are having side effects from your medicines.  You are struggling with fatigue, mood changes, depression, or anxiety.   This information is not intended to replace advice given to you by your health care provider. Make sure you discuss any questions you have with your health care provider.   Document Released: 02/27/2004 Document Revised: 06/22/2014 Document Reviewed: 11/09/2013 Elsevier Interactive Patient Education [employer]2016 Elsevier Inc.

## 2016-01-22 ENCOUNTER — Other Ambulatory Visit: Payer: Self-pay | Admitting: Internal Medicine

## 2016-01-22 ENCOUNTER — Telehealth: Payer: Self-pay

## 2016-01-22 DIAGNOSIS — E0590 Thyrotoxicosis, unspecified without thyrotoxic crisis or storm: Secondary | ICD-10-CM

## 2016-01-22 LAB — RPR

## 2016-01-22 LAB — HIV ANTIBODY (ROUTINE TESTING W REFLEX): HIV: NONREACTIVE

## 2016-01-22 NOTE — Telephone Encounter (Signed)
Contacted pt to go over lab results pt is aware of results and the ultrasound that has to be schedule. Pt ultrasound is schedule is schedule for January 29, 2016 @8am  [facility]745am  [facility]. Pt is aware of appointment

## 2016-01-23 ENCOUNTER — Emergency Department (HOSPITAL_COMMUNITY)
Admission: EM | Admit: 2016-01-23 | Discharge: 2016-01-23 | Disposition: A | Discharge status: Home / Self Care | Payer: Medicaid Other | Attending: Emergency Medicine | Admitting: Emergency Medicine

## 2016-01-23 ENCOUNTER — Encounter (HOSPITAL_COMMUNITY): Payer: Self-pay | Admitting: Emergency Medicine

## 2016-01-23 DIAGNOSIS — M542 Cervicalgia: Secondary | ICD-10-CM

## 2016-01-23 DIAGNOSIS — H748X2 Other specified disorders of left middle ear and mastoid: Secondary | ICD-10-CM | POA: Insufficient documentation

## 2016-01-23 DIAGNOSIS — H65192 Other acute nonsuppurative otitis media, left ear: Secondary | ICD-10-CM

## 2016-01-23 MED ORDER — PSEUDOEPHEDRINE HCL 60 MG PO TABS: 60.0000 mg | ORAL_TABLET | ORAL | 0 refills | Status: DC | PRN

## 2016-01-23 MED ORDER — IBUPROFEN 800 MG PO TABS
800.0000 mg | ORAL_TABLET | Freq: Three times a day (TID) | ORAL | 0 refills | Status: DC
Start: 2016-01-23 — End: 2016-12-18

## 2016-01-23 NOTE — ED Provider Notes (Signed)
[facility]-EMERGENCY DEPT Provider Note   First Provider Contact:   First MD Initiated Contact with Patient 01/23/16 1512     History   Chief Complaint Chief Complaint  Patient presents with  . Migraine    headache pain x 3 days   Patient is a 25 year old female with no pertinent past medical history presents the ED with complaint of left ear pain and neck pain. Patient reports she has had pain to both areas for the past few months. Patient describes having intermittent "burning" pain in her left ear, denies any aggravating or alleviating factors. Denies redness, swelling, warmth, tenderness to palpation, ear drainage or loss of hearing. She also reports having intermittent pain to the back of her neck which she states radiates up the back of her scalp. Denies any aggravating or alleviating factors. 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 She notes she has since quit her job due to her neck pain. Endorses associated intermittent tingling in bilateral upper arms but denies currently having any symptoms. Denies fever, chills, headache, visual changes, lightheadedness, dizziness, neck stiffness, nasal congestion, rhinorrhea, cough, sore throat, shortness of breath, chest pain, abdominal pain, nausea, vomiting, numbness, tingling, weakness. She notes she has been seen in the ED multiple times, has had x-rays and CTs performed but is still unsure what is causing her pain. She states she has taken naproxen, Flexeril, Valium and Tylenol without relief. She notes she was seen by her PCP on 8/8 for follow-up, notes she had labs performed which showed an abnormality in her thyroid (?hyperactive) and states she has a follow-up thyroid ultrasound scheduled for next week. Patient states her PCP prescribed her prescription of Voltaren gel but notes she has been unable to fill it due to financial reasons. She  states she is planning on filling the prescription tomorrow when she is paid.   The history is provided by the patient and medical records. No language interpreter was used.      Past Medical History:  Diagnosis Date  . Thyroid disease     Patient Active Problem List   Diagnosis Date Noted  . GERD without esophagitis 03/15/2014    Past Surgical History:  Procedure Laterality Date  . ANTERIOR CRUCIATE LIGAMENT REPAIR  Left knee  . WISDOM TOOTH EXTRACTION      OB History    Gravida Para Term Preterm AB Living   1 1       0   SAB TAB Ectopic Multiple Live Births         0         Home Medications    Prior to Admission medications   Medication Sig Start Date End Date Taking? Authorizing Provider  diclofenac sodium (VOLTAREN) 1 % GEL Apply 2 g topically 4 (four) times daily. 01/21/16   Pete Glatterawn T Langeland, MD  ibuprofen (ADVIL,MOTRIN) 800 MG tablet Take 1 tablet (800 mg total) by mouth 3 (three) times daily. 01/23/16   Barrett HenleNicole Elizabeth Nadeau, PA-C  pseudoephedrine (SUDAFED) 60 MG tablet Take 1 tablet (60 mg total) by mouth every 4 (four) hours as needed for congestion. 01/23/16   Barrett HenleNicole Elizabeth Nadeau, PA-C    Family History Family History  Problem Relation Age of Onset  . Thyroid disease Mother   . Hypertension Other     Social History Social History  Substance Use Topics  . Smoking status: Never Smoker  .  Smokeless tobacco: Never Used  . Alcohol use 0.0 oz/week     Comment: occ     Allergies   Review of patient's allergies indicates no known allergies.   Review of Systems Review of Systems  HENT: Positive for ear pain.   Musculoskeletal: Positive for neck pain.  Neurological:       Tingling  All other systems reviewed and are negative.    Physical Exam Updated Vital Signs BP 131/79   Pulse 80   Temp 98.2 F (36.8 C) (Oral)   Resp 18   LMP 01/12/2016   SpO2 99%   Physical Exam  Constitutional: She is oriented to person, place, and time. She  appears well-developed and well-nourished.  Pt appears anxious on exam.  HENT:  Head: Normocephalic and atraumatic.  Right Ear: Hearing, tympanic membrane, external ear and ear canal normal. No drainage or tenderness. No mastoid tenderness. Tympanic membrane is not perforated, not erythematous, not retracted and not bulging.  Left Ear: Hearing, external ear and ear canal normal. No drainage or tenderness. No mastoid tenderness. Tympanic membrane is not perforated, not erythematous, not retracted and not bulging. A middle ear effusion is present.  Nose: Nose normal. Right sinus exhibits no maxillary sinus tenderness and no frontal sinus tenderness. Left sinus exhibits no maxillary sinus tenderness and no frontal sinus tenderness.  Mouth/Throat: Uvula is midline, oropharynx is clear and moist and mucous membranes are normal. No oropharyngeal exudate, posterior oropharyngeal edema, posterior oropharyngeal erythema or tonsillar abscesses.  Eyes: Conjunctivae and EOM are normal. Pupils are equal, round, and reactive to light. Right eye exhibits no discharge. Left eye exhibits no discharge. No scleral icterus.  Neck: Normal range of motion. Neck supple.  Cardiovascular: Normal rate, regular rhythm, normal heart sounds and intact distal pulses.   Pulmonary/Chest: Effort normal and breath sounds normal. No respiratory distress. She has no wheezes. She has no rales. She exhibits no tenderness.  Abdominal: Soft. Bowel sounds are normal. There is no tenderness.  Musculoskeletal: Normal range of motion. She exhibits no edema or tenderness.  No midline C, T, or L tenderness. Full range of motion of neck and back. Full range of motion of bilateral upper and lower extremities, with 5/5 strength. Sensation intact. 2+ radial and PT pulses. Cap refill <2 seconds. Patient able to stand and ambulate without assistance.    Lymphadenopathy:    She has no cervical adenopathy.  Neurological: She is alert and oriented to  person, place, and time. She has normal strength. No cranial nerve deficit or sensory deficit. Coordination and gait normal.  Skin: Skin is warm and dry. Capillary refill takes less than 2 seconds. She is not diaphoretic.  Nursing note and vitals reviewed.    ED Treatments / Results  Labs (all labs ordered are listed, but only abnormal results are displayed) Labs Reviewed - No data to display  EKG  EKG Interpretation None       Radiology No results found.  Procedures Procedures (including critical care time)  Medications Ordered in ED Medications - No data to display   Initial Impression / Assessment and Plan / ED Course  I have reviewed the triage vital signs and the nursing notes.  Pertinent labs & imaging results that were available during my care of the patient were reviewed by me and considered in my medical decision making (see chart for details).  Clinical Course   Patient presents with left ear pain and neck pain which she has had for the past  few months. Denies any recent fall or injury. Endorses bilateral intermittent upper extremity paresthesias over the past few months. This is the patient's fifth visit to the ED for similar symptoms. Chart review shows she has had a negative CT head and maxillofacial and negative C-spine x-ray. Patient was seen on 8/80 by her PCP for follow-up, labs revealed decreased TSH, patient scheduled to have thyroid ultrasound performed next week. VSS. On exam patient anxious appearing, small left serous effusion noted, no cervical midline tenderness. Remaining exam unremarkable. No neuro deficits. Bilateral upper extremity symmetrically intact. Due to patient's symptoms being unchanged over the past few months and having multiple imaging studies and labs performed, I do not feel any further workup is warranted at this time. Suspect neck pain may likely be due to muscle strain/cervical radiculopathy. Plan to discharge patient home with NSAIDs,  symptomatically treatment advised patient to try her Voltaren gel as prescribed. Will discharge patient home with decongestion for ear effusion. Advised patient to follow up at her scheduled appointment next week for her ultrasound and to follow-up with her PCP at her next scheduled appointment. Discussed return precautions with patient.  Final Clinical Impressions(s) / ED Diagnoses   Final diagnoses:  Neck pain  Acute effusion of left ear    New Prescriptions Discharge Medication List as of 01/23/2016  3:34 PM    START taking these medications   Details  ibuprofen (ADVIL,MOTRIN) 800 MG tablet Take 1 tablet (800 mg total) by mouth 3 (three) times daily., Starting Thu 01/23/2016, Print    pseudoephedrine (SUDAFED) 60 MG tablet Take 1 tablet (60 mg total) by mouth every 4 (four) hours as needed for congestion., Starting Thu 01/23/2016, Print         Satira Sark [place], [place] 01/23/16 1549    Azalia Bilis, MD 01/23/16 [phone]

## 2016-01-23 NOTE — Discharge Instructions (Signed)
Take your medications as prescribed. I also recommend using the vault tearing gel which was prescribed to you by your family doctor as prescribed. You may also apply ice and/or heat to affected area for 5 to 20 minutes 3-4 times daily.  Follow-up at your scheduled appointment next week to have the ultrasound performed for further evaluation of your thyroid. Follow-up with your family doctor at your next scheduled appointment. Return to the emergency department if symptoms worsen or new onset of fever, neck stiffness, visual changes, lightheadedness, dizziness, numbness, tingling, weakness.

## 2016-01-29 ENCOUNTER — Ambulatory Visit (HOSPITAL_COMMUNITY)
Admission: RE | Admit: 2016-01-29 | Discharge: 2016-01-29 | Disposition: A | Discharge status: Home / Self Care | Payer: Self-pay | Source: Ambulatory Visit | Attending: Internal Medicine | Admitting: Internal Medicine

## 2016-01-29 DIAGNOSIS — E0590 Thyrotoxicosis, unspecified without thyrotoxic crisis or storm: Secondary | ICD-10-CM | POA: Insufficient documentation

## 2016-01-31 ENCOUNTER — Telehealth: Payer: Self-pay | Admitting: Internal Medicine

## 2016-01-31 NOTE — Telephone Encounter (Signed)
Pt. Came into facility requesting her ultrasound results. Please f/u

## 2016-02-03 NOTE — Telephone Encounter (Signed)
Contacted pt to see if she was needing her ultrasound results printed out pt states she can see it online. Pt states she is suppose to come in for further blood work. Pt is schedule for blood work on Wednesday @830 

## 2016-02-05 ENCOUNTER — Ambulatory Visit: Payer: Self-pay | Attending: Internal Medicine

## 2016-02-05 DIAGNOSIS — E0590 Thyrotoxicosis, unspecified without thyrotoxic crisis or storm: Secondary | ICD-10-CM | POA: Insufficient documentation

## 2016-02-05 LAB — T3, FREE: T3 FREE: 3.1 pg/mL (ref 2.3–4.2)

## 2016-02-05 NOTE — Progress Notes (Signed)
Pt is here for lab work only. 

## 2016-02-06 LAB — THYROID PEROXIDASE ANTIBODY: Thyroperoxidase Ab SerPl-aCnc: 1 IU/mL (ref <9)

## 2016-02-23 ENCOUNTER — Inpatient Hospital Stay (HOSPITAL_COMMUNITY)
Admission: AD | Admit: 2016-02-23 | Discharge: 2016-02-23 | Disposition: A | Discharge status: Home / Self Care | Payer: Self-pay | Source: Ambulatory Visit | Attending: Obstetrics and Gynecology | Admitting: Obstetrics and Gynecology

## 2016-02-23 ENCOUNTER — Encounter (HOSPITAL_COMMUNITY): Payer: Self-pay | Admitting: *Deleted

## 2016-02-23 DIAGNOSIS — K219 Gastro-esophageal reflux disease without esophagitis: Secondary | ICD-10-CM | POA: Insufficient documentation

## 2016-02-23 DIAGNOSIS — N644 Mastodynia: Secondary | ICD-10-CM | POA: Insufficient documentation

## 2016-02-23 DIAGNOSIS — E079 Disorder of thyroid, unspecified: Secondary | ICD-10-CM | POA: Insufficient documentation

## 2016-02-23 DIAGNOSIS — N6002 Solitary cyst of left breast: Secondary | ICD-10-CM | POA: Insufficient documentation

## 2016-02-23 LAB — POCT PREGNANCY, URINE: Preg Test, Ur: NEGATIVE

## 2016-02-23 MED ORDER — CEPHALEXIN 500 MG PO CAPS: 500.0000 mg | ORAL_CAPSULE | Freq: Four times a day (QID) | ORAL | 0 refills | Status: DC

## 2016-02-23 NOTE — MAU Note (Signed)
Pt states she has a dull pain in her left breast that has been going on for three days.

## 2016-02-23 NOTE — MAU Note (Signed)
Urine in the lab  

## 2016-02-23 NOTE — Discharge Instructions (Signed)
Call [facility] tomorrow to schedule follow up.   Breast Cyst A breast cyst is a sac in the breast that is filled with fluid. Breast cysts are common in women. Women can have one or many cysts. When the breasts contain many cysts, it is usually due to a noncancerous (benign) condition called fibrocystic change. These lumps form under the influence of female hormones (estrogen and progesterone). The lumps are most often located in the upper, outer portion of the breast. They are often more swollen, painful, and tender before your period starts. They usually disappear after menopause, unless you are on hormone therapy.  There are several types of cysts:  Macrocyst. This is a cyst that is about 2 in. (5.1 cm) in diameter.   Microcyst. This is a tiny cyst that you cannot feel but can be seen with a mammogram or an ultrasound.   Galactocele. This is a cyst containing milk that may develop if you suddenly stop breastfeeding.   Sebaceous cyst of the skin. This type of cyst is not in the breast tissue itself. Breast cysts do not increase your risk of breast cancer. However, they must be monitored closely because they can be cancerous.  CAUSES  It is not known exactly what causes a breast cyst to form. Possible causes include:  An overgrowth of milk glands and connective tissue in the breast can block the milk glands, causing them to fill with fluid.   Scar tissue in the breast from previous surgery may block the glands, causing a cyst.  RISK FACTORS Estrogen may influence the development of a breast cyst.  SIGNS AND SYMPTOMS   Feeling a smooth, round, soft lump (like a grape) in the breast that is easily moveable.   Breast discomfort or pain.  Increase in size of the lump before your menstrual period and decrease in its size after your menstrual period.  DIAGNOSIS  A cyst can be felt during a physical exam by your health care provider. A breast X-ray exam (mammogram) and  ultrasonography will be done to confirm the diagnosis. Fluid may be removed from the cyst with a needle (fine needle aspiration) to make sure the cyst is not cancerous.  TREATMENT  Treatment may not be necessary. Your health care provider may monitor the cyst to see if it goes away on its own. If treatment is needed, it may include:  Hormone treatment.   Needle aspiration. There is a chance of the cyst coming back after aspiration.   Surgery to remove the whole cyst.  HOME CARE INSTRUCTIONS   Keep all follow-up appointments with your health care provider.  See your health care provider regularly:  Get a yearly exam by your health care provider.  Have a clinical breast exam by a health care provider every 1-3 years if you are 77-3 years of age. After age 40 years, you should have the exam every year.   Get mammogram tests as directed by your health care provider.   Understand the normal appearance and feel of your breasts and perform breast self-exams.   Only take over-the-counter or prescription medicines as directed by your health care provider.   Wear a supportive bra, especially when exercising.   Avoid caffeine.   Reduce your salt intake, especially before your menstrual period. Too much salt can cause fluid retention, breast swelling, and discomfort.  SEEK MEDICAL CARE IF:   You feel, or think you feel, a lump in your breast.   You notice that  both breasts look or feel different than usual.   Your breast is still causing pain after your menstrual period is over.   You need medicine for breast pain and swelling that occurs with your menstrual period.  SEEK IMMEDIATE MEDICAL CARE IF:   You have severe pain, tenderness, redness, or warmth in your breast.   You have nipple discharge or bleeding.   Your breast lump becomes hard and painful.   You find new lumps or bumps that were not there before.   You feel lumps in your armpit (axilla).   You  notice dimpling or wrinkling of the breast or nipple.   You have a fever.  MAKE SURE YOU:  Understand these instructions.  Will watch your condition.  Will get help right away if you are not doing well or get worse.   This information is not intended to replace advice given to you by your health care provider. Make sure you discuss any questions you have with your health care provider.   Document Released: 06/01/2005 Document Revised: 02/01/2013 Document Reviewed: 12/29/2012 Elsevier Interactive Patient Education [employer]2016 Elsevier Inc.

## 2016-02-23 NOTE — MAU Provider Note (Signed)
[facility]    Provider Note   CSN: [id number] Arrival date & time: 02/23/16  1459     History   Chief Complaint Chief Complaint  Patient presents with  . Breast Pain    HPI Bailey Hicks is a 25 y.o. G1P1 who is not pregnant presents to the MAU with left breast pain. Patient reports that she noted an area a few days ago that was sore and it has gotten worse. She denies fever or chills or other problems.   HPI  Past Medical History:  Diagnosis Date  . Thyroid disease     Patient Active Problem List   Diagnosis Date Noted  . GERD without esophagitis 03/15/2014    Past Surgical History:  Procedure Laterality Date  . ANTERIOR CRUCIATE LIGAMENT REPAIR  Left knee  . WISDOM TOOTH EXTRACTION      OB History    Gravida Para Term Preterm AB Living   1 1       0   SAB TAB Ectopic Multiple Live Births         0         Home Medications    Prior to Admission medications   Medication Sig Start Date End Date Taking? Authorizing Provider  cephALEXin (KEFLEX) 500 MG capsule Take 1 capsule (500 mg total) by mouth 4 (four) times daily. 02/23/16   Tamiyah Moulin Orlene OchM Jacolby Risby, NP  diclofenac sodium (VOLTAREN) 1 % GEL Apply 2 g topically 4 (four) times daily. 01/21/16   Pete Glatterawn T Langeland, MD  ibuprofen (ADVIL,MOTRIN) 800 MG tablet Take 1 tablet (800 mg total) by mouth 3 (three) times daily. 01/23/16   Barrett HenleNicole Elizabeth Nadeau, PA-C  pseudoephedrine (SUDAFED) 60 MG tablet Take 1 tablet (60 mg total) by mouth every 4 (four) hours as needed for congestion. 01/23/16   Barrett HenleNicole Elizabeth Nadeau, PA-C    Family History Family History  Problem Relation Age of Onset  . Thyroid disease Mother   . Hypertension Other     Social History Social History  Substance Use Topics  . Smoking status: Never Smoker  . Smokeless tobacco: Never Used  . Alcohol use 0.0 oz/week     Comment: occ     Allergies   Review of patient's allergies indicates no known allergies.   Review of Systems Review of  Systems  HENT: Negative.   Cardiovascular:       Breast pain  Gastrointestinal: Negative for abdominal pain.  Musculoskeletal: Negative for arthralgias.  Skin: Negative for rash.  Psychiatric/Behavioral: The patient is not nervous/anxious.      Physical Exam Updated Vital Signs BP 117/71 (BP Location: Right Arm)   Pulse 75   Temp 98.1 F (36.7 C) (Oral)   Resp 18   LMP 02/13/2016   Physical Exam  Constitutional: She is oriented to person, place, and time. She appears well-developed and well-nourished.  HENT:  Head: Normocephalic and atraumatic.  Eyes: EOM are normal.  Neck: Neck supple.  Cardiovascular: Normal rate.   Pulmonary/Chest: Effort normal. No respiratory distress. Right breast exhibits mass and tenderness. Right breast exhibits no inverted nipple. Left breast exhibits no inverted nipple.  Left breast with pea side node palpated at 5 o'clock at the areola. There is mild tenderness.    Abdominal: Soft. There is no tenderness.  Genitourinary: No breast discharge or bleeding.  Musculoskeletal: Normal range of motion.  Neurological: She is alert and oriented to person, place, and time. No cranial nerve deficit.  Skin: Skin is warm  and dry.  Psychiatric: She has a normal mood and affect. Her behavior is normal.  Nursing note and vitals reviewed.    ED Treatments / Results  Labs (all labs ordered are listed, but only abnormal results are displayed) Labs Reviewed  POCT PREGNANCY, URINE   Radiology No results found.  Procedures Procedures (including critical care time)  Medications Ordered in ED Medications - No data to display   Initial Impression / Assessment and Plan / ED Course  I have reviewed the triage vital signs and the nursing notes.  Pertinent labs & imaging results that were available during my care of the patient were reviewed by me and considered in my medical decision making (see chart for details).  Clinical Course   Discussed plan of  care with the patient and she voices understanding and agrees with plan.   Final Clinical Impressions(s) / ED Diagnoses  25 y.o. female with left breast cyst and tenderness stable for d[facility]c [facility]. She will call tomorrow to schedule appointment. Will start Keflex and she will apply warm wet compresses to the area in the event there is infection.   Final diagnoses:  Cyst of left breast    New Prescriptions Discharge Medication List as of 02/23/2016  3:42 PM    START taking these medications   Details  cephALEXin (KEFLEX) 500 MG capsule Take 1 capsule (500 mg total) by mouth 4 (four) times daily., Starting Sun 02/23/2016, Normal

## 2016-02-24 ENCOUNTER — Other Ambulatory Visit (HOSPITAL_COMMUNITY): Payer: Self-pay | Admitting: Advanced Practice Midwife

## 2016-02-24 DIAGNOSIS — N6320 Unspecified lump in the left breast, unspecified quadrant: Secondary | ICD-10-CM

## 2016-02-24 NOTE — Progress Notes (Signed)
Left breast mass seen on exam yesterday Order not placed for [facility]  I put in order and called for appt.  No insurance, so message left with [facility].   Aviva SignsMarie L Jaia Alonge, CNM

## 2016-02-25 ENCOUNTER — Other Ambulatory Visit (HOSPITAL_COMMUNITY): Payer: Self-pay | Admitting: *Deleted

## 2016-02-25 DIAGNOSIS — N6320 Unspecified lump in the left breast, unspecified quadrant: Secondary | ICD-10-CM

## 2016-03-06 ENCOUNTER — Ambulatory Visit (HOSPITAL_COMMUNITY)
Admission: RE | Admit: 2016-03-06 | Discharge: 2016-03-06 | Disposition: A | Discharge status: Home / Self Care | Payer: Self-pay | Source: Ambulatory Visit | Attending: Obstetrics and Gynecology | Admitting: Obstetrics and Gynecology

## 2016-03-06 ENCOUNTER — Ambulatory Visit
Admission: RE | Admit: 2016-03-06 | Discharge: 2016-03-06 | Disposition: A | Discharge status: Home / Self Care | Payer: No Typology Code available for payment source | Source: Ambulatory Visit | Attending: Obstetrics and Gynecology | Admitting: Obstetrics and Gynecology

## 2016-03-06 ENCOUNTER — Encounter (HOSPITAL_COMMUNITY): Payer: Self-pay

## 2016-03-06 VITALS — BP 118/80 | Temp 98.4°F | Ht 65.0 in | Wt 223.2 lb

## 2016-03-06 DIAGNOSIS — N6320 Unspecified lump in the left breast, unspecified quadrant: Secondary | ICD-10-CM

## 2016-03-06 DIAGNOSIS — Z1239 Encounter for other screening for malignant neoplasm of breast: Secondary | ICD-10-CM

## 2016-03-06 HISTORY — DX: Anemia, unspecified: D64.9

## 2016-03-06 NOTE — Patient Instructions (Addendum)
Explained breast self awareness to [facility]. Patient did not need a Pap smear today due to last Pap smear was in July 2015 per patient. Let her know [facility] will cover Pap smears every 3 years unless has a history of abnormal Pap smears. Reminded patient that her next Pap smear will be due next July 2018 and to call Martie LeeSabrina to schedule with [facility]. [facility]. Appointment scheduled for Friday, March 06, 2016 at 1440. Vladimir FasterAlicia Sovine verbalized understanding.  Rogers Ditter, Kathaleen Maserhristine Poll, RN 2:19 PM

## 2016-03-06 NOTE — Progress Notes (Signed)
Complaints of a left breast lump x 2 weeks that has decreased in size. Patient stated is was tender to touch but the pain has decreased. Patient stated she did not take the antibiotics prescribed when she went to the MAU on 02/23/2016.  Pap Smear: Pap smear not completed today. [facility] and normal per patient. Per patient has no history of an abnormal Pap smear. No Pap smear results are in EPIC.  Physical exam: Breasts Breasts symmetrical. No skin abnormalities bilateral breasts. No nipple retraction bilateral breasts. No nipple discharge bilateral breasts. No lymphadenopathy. No lumps palpated right breast. Palpated a pea sized lump versus thickened skin within the left breast at 5 o'clock under areola. No complaints of pain or tenderness on exam. [facility]. Appointment scheduled for Friday, March 06, 2016 at 1440.        Pelvic/Bimanual No Pap smear completed today since last Pap smear was in July 2015 per patient. Pap smear not indicated per [facility] guidelines.   Smoking History: Patient has never smoked.  Patient Navigation: Patient education provided. [facility] services [facility].

## 2016-03-09 ENCOUNTER — Inpatient Hospital Stay: Admission: RE | Admit: 2016-03-09 | Payer: Self-pay | Source: Ambulatory Visit

## 2016-03-10 ENCOUNTER — Encounter (HOSPITAL_COMMUNITY): Payer: Self-pay | Admitting: *Deleted

## 2016-03-12 ENCOUNTER — Other Ambulatory Visit: Payer: Self-pay

## 2016-04-06 ENCOUNTER — Ambulatory Visit: Payer: Medicaid Other | Admitting: Obstetrics & Gynecology

## 2016-04-08 ENCOUNTER — Ambulatory Visit: Payer: Self-pay | Admitting: Obstetrics and Gynecology

## 2016-06-09 ENCOUNTER — Inpatient Hospital Stay (HOSPITAL_COMMUNITY)
Admission: AD | Admit: 2016-06-09 | Discharge: 2016-06-09 | Disposition: A | Discharge status: Home / Self Care | Payer: No Typology Code available for payment source | Source: Ambulatory Visit | Attending: Family Medicine | Admitting: Family Medicine

## 2016-06-09 ENCOUNTER — Encounter (HOSPITAL_COMMUNITY): Payer: Self-pay | Admitting: *Deleted

## 2016-06-09 DIAGNOSIS — N9489 Other specified conditions associated with female genital organs and menstrual cycle: Secondary | ICD-10-CM | POA: Insufficient documentation

## 2016-06-09 DIAGNOSIS — R102 Pelvic and perineal pain: Secondary | ICD-10-CM

## 2016-06-09 DIAGNOSIS — Z8619 Personal history of other infectious and parasitic diseases: Secondary | ICD-10-CM | POA: Insufficient documentation

## 2016-06-09 DIAGNOSIS — N940 Mittelschmerz: Secondary | ICD-10-CM

## 2016-06-09 DIAGNOSIS — Z8349 Family history of other endocrine, nutritional and metabolic diseases: Secondary | ICD-10-CM | POA: Insufficient documentation

## 2016-06-09 LAB — CBC
HEMATOCRIT: 39.3 % (ref 36.0–46.0)
HEMOGLOBIN: 12.9 g/dL (ref 12.0–15.0)
MCH: 29.1 pg (ref 26.0–34.0)
MCHC: 32.8 g/dL (ref 30.0–36.0)
MCV: 88.5 fL (ref 78.0–100.0)
Platelets: 257 10*3/uL (ref 150–400)
RBC: 4.44 MIL/uL (ref 3.87–5.11)
RDW: 14 % (ref 11.5–15.5)
WBC: 8.1 10*3/uL (ref 4.0–10.5)

## 2016-06-09 LAB — WET PREP, GENITAL
Sperm: NONE SEEN
Trich, Wet Prep: NONE SEEN
YEAST WET PREP: NONE SEEN

## 2016-06-09 LAB — URINALYSIS, ROUTINE W REFLEX MICROSCOPIC
BILIRUBIN URINE: NEGATIVE
Glucose, UA: NEGATIVE mg/dL
HGB URINE DIPSTICK: NEGATIVE
KETONES UR: NEGATIVE mg/dL
Leukocytes, UA: NEGATIVE
NITRITE: NEGATIVE
PROTEIN: NEGATIVE mg/dL
Specific Gravity, Urine: 1.02 (ref 1.005–1.030)
pH: 7 (ref 5.0–8.0)

## 2016-06-09 LAB — POCT PREGNANCY, URINE: PREG TEST UR: NEGATIVE

## 2016-06-09 NOTE — MAU Note (Signed)
Pt C/O RLQ pain for the last week, has slight discharge, no bleeding.  Denies fever, vomiting, diarrhea or dysuria.

## 2016-06-09 NOTE — MAU Note (Signed)
PT SAYS  ON LAST Friday  SHE  STARTED  FEELING PAIN ON LOWER RIGHT  SIDE - CRAMP CONSTANTLY   .   TOOK ALEVE  ON SAT  NIGHT -   SOME  RELIEF .   NO N/V /D.    LAST BM-     THIS  AM.    NO  PROBLEM VOIDING.      LAST SEX-  Sunday-      SAME  PARTNER -   .   NO UNUSUAL   D/C.   NO   ODORS.    NO FEVER.     NO BIRTH  CONTROL

## 2016-06-09 NOTE — MAU Provider Note (Signed)
History     CSN: [id number]  Arrival date and time: 06/09/16 1754   First Provider Initiated Contact with Patient 06/09/16 2057      Chief Complaint  Patient presents with  . Abdominal Pain   Non-pregnant female here with c/o RLQ pain x2 days. She describes pain as constant and crampy. She reports similar pain last year and was found she had a left ovarian cyst. She used Aleve and had some relief. She denies dyspareunia. She denies fever. She denies vaginal discharge or malodor.    Pertinent Gynecological History: Menses: 05/22/16 Contraception: none Sexually transmitted diseases: past history: GC, CT 2013 Last pap: normal Date: 2016   Past Medical History:  Diagnosis Date  . Anemia     Past Surgical History:  Procedure Laterality Date  . ANTERIOR CRUCIATE LIGAMENT REPAIR  Left knee  . WISDOM TOOTH EXTRACTION      Family History  Problem Relation Age of Onset  . Thyroid disease Mother   . Hypertension Other     Social History  Substance Use Topics  . Smoking status: Never Smoker  . Smokeless tobacco: Never Used  . Alcohol use 0.0 oz/week     Comment: occ    Allergies: No Known Allergies  Prescriptions Prior to Admission  Medication Sig Dispense Refill Last Dose  . cephALEXin (KEFLEX) 500 MG capsule Take 1 capsule (500 mg total) by mouth 4 (four) times daily. (Patient not taking: Reported on 03/06/2016) 20 capsule 0 Not Taking  . diclofenac sodium (VOLTAREN) 1 % GEL Apply 2 g topically 4 (four) times daily. (Patient not taking: Reported on 03/06/2016) 100 g 2 Not Taking  . ibuprofen (ADVIL,MOTRIN) 800 MG tablet Take 1 tablet (800 mg total) by mouth 3 (three) times daily. (Patient not taking: Reported on 06/09/2016) 21 tablet 0 Not Taking at Unknown time  . pseudoephedrine (SUDAFED) 60 MG tablet Take 1 tablet (60 mg total) by mouth every 4 (four) hours as needed for congestion. (Patient not taking: Reported on 03/06/2016) 30 tablet 0 Not Taking    Review of  Systems  Constitutional: Negative.   Gastrointestinal: Positive for abdominal pain. Negative for constipation, diarrhea, nausea and vomiting.  Genitourinary: Negative.    Physical Exam   Blood pressure 125/81, pulse 83, temperature 98.3 F (36.8 C), temperature source Oral, resp. rate 16, height 5\' 5"  (1.651 m), weight 103 kg (227 lb), last menstrual period 05/22/2016.  Physical Exam  Constitutional: She is oriented to person, place, and time. She appears well-developed and well-nourished. No distress.  HENT:  Head: Normocephalic and atraumatic.  Neck: Normal range of motion. Neck supple.  Cardiovascular: Normal rate.   Respiratory: Effort normal.  GI: Soft. She exhibits no distension and no mass. There is no tenderness. There is no rebound and no guarding.  Genitourinary:  Genitourinary Comments: External: no lesions or erythema Vagina: rugated, nulli, thin white discharge Uterus: non enlarged, anteverted, non tender, no CMT Adnexae: no masses, no tenderness left, no tenderness right   Musculoskeletal: Normal range of motion.  Neurological: She is alert and oriented to person, place, and time.  Skin: Skin is warm and dry.  Psychiatric: She has a normal mood and affect.   Results for orders placed or performed during the hospital encounter of 06/09/16 (from the past 24 hour(s))  Urinalysis, Routine w reflex microscopic     Status: None   Collection Time: 06/09/16  6:23 PM  Result Value Ref Range   Color, Urine YELLOW YELLOW   APPearance  CLEAR CLEAR   Specific Gravity, Urine 1.020 1.005 - 1.030   pH 7.0 5.0 - 8.0   Glucose, UA NEGATIVE NEGATIVE mg/dL   Hgb urine dipstick NEGATIVE NEGATIVE   Bilirubin Urine NEGATIVE NEGATIVE   Ketones, ur NEGATIVE NEGATIVE mg/dL   Protein, ur NEGATIVE NEGATIVE mg/dL   Nitrite NEGATIVE NEGATIVE   Leukocytes, UA NEGATIVE NEGATIVE  Pregnancy, urine POC     Status: None   Collection Time: 06/09/16  6:29 PM  Result Value Ref Range   Preg Test,  Ur NEGATIVE NEGATIVE  Wet prep, genital     Status: Abnormal   Collection Time: 06/09/16  8:32 PM  Result Value Ref Range   Yeast Wet Prep HPF POC NONE SEEN NONE SEEN   Trich, Wet Prep NONE SEEN NONE SEEN   Clue Cells Wet Prep HPF POC PRESENT (A) NONE SEEN   WBC, Wet Prep HPF POC FEW (A) NONE SEEN   Sperm NONE SEEN   CBC     Status: None   Collection Time: 06/09/16  9:07 PM  Result Value Ref Range   WBC 8.1 4.0 - 10.5 K/uL   RBC 4.44 3.87 - 5.11 MIL/uL   Hemoglobin 12.9 12.0 - 15.0 g/dL   HCT 40.939.3 81.136.0 - 91.446.0 %   MCV 88.5 78.0 - 100.0 fL   MCH 29.1 26.0 - 34.0 pg   MCHC 32.8 30.0 - 36.0 g/dL   RDW 78.214.0 95.611.5 - 21.315.5 %   Platelets 257 150 - 400 K/uL   MAU Course  Procedures  MDM No evidence of acute abdominal or pelvic process. Pain likely ovulatory. Stable for discharge home.  Assessment and Plan   1. Pelvic pain   2. Ovulatory pain    Discharge home Follow up in [facility] as needed Return to MAU for OBGYN emergencies  Allergies as of 06/09/2016   No Known Allergies     Medication List    STOP taking these medications   cephALEXin 500 MG capsule Commonly known as:  KEFLEX   diclofenac sodium 1 % Gel Commonly known as:  VOLTAREN   pseudoephedrine 60 MG tablet Commonly known as:  SUDAFED     TAKE these medications   ibuprofen 800 MG tablet Commonly known as:  ADVIL,MOTRIN Take 1 tablet (800 mg total) by mouth 3 (three) times daily.      Donette LarryMelanie Rayvion Stumph, CNM 06/09/2016, 8:58 PM

## 2016-06-09 NOTE — Discharge Instructions (Signed)
Pelvic Pain, Female Pelvic pain is pain felt below the belly button and between your hips. It can be caused by many different things. It is important to get help right away. This is especially true for severe, sharp, or unusual pain that comes on suddenly.  HOME CARE  Only take medicine as told by your doctor.  Rest as told by your doctor.  Eat a healthy diet, such as fruits, vegetables, and lean meats.  Drink enough fluids to keep your pee (urine) clear or pale yellow, or as told.  Avoid sex (intercourse) if it causes pain.  Apply warm or cold packs to your lower belly (abdomen). Use the type of pack that helps the pain.  Avoid situations that cause you stress.  Keep a journal to track your pain. Write down:  When the pain started.  Where it is located.  If there are things that seem to be related to the pain, such as food or your period.  Follow up with your doctor as told. GET HELP RIGHT AWAY IF:   You have heavy bleeding from the vagina.  You have more pelvic pain.  You feel lightheaded or pass out (faint).  You have chills.  You have pain when you pee or have blood in your pee.  You cannot stop having watery poop (diarrhea).  You cannot stop throwing up (vomiting).  You have a fever or lasting symptoms for more than 3 days.  You have a fever and your symptoms suddenly get worse.  You are being physically or sexually abused.  Your medicine does not help your pain.  You have fluid (discharge) coming from your vagina that is not normal. MAKE SURE YOU:  Understand these instructions.  Will watch your condition.  Will get help if you are not doing well or get worse. This information is not intended to replace advice given to you by your health care provider. Make sure you discuss any questions you have with your health care provider. Document Released: 11/18/2007 Document Revised: 06/22/2014 Document Reviewed: 03/22/2015 Elsevier Interactive Patient  Education  2017 Else[employer] Inc.  

## 2016-06-10 LAB — GC/CHLAMYDIA PROBE AMP (~~LOC~~) NOT AT ARMC
CHLAMYDIA, DNA PROBE: NEGATIVE
Neisseria Gonorrhea: NEGATIVE

## 2016-09-17 ENCOUNTER — Emergency Department (HOSPITAL_COMMUNITY)
Admission: EM | Admit: 2016-09-17 | Discharge: 2016-09-18 | Disposition: A | Discharge status: Home / Self Care | Payer: No Typology Code available for payment source | Attending: Emergency Medicine | Admitting: Emergency Medicine

## 2016-09-17 ENCOUNTER — Encounter (HOSPITAL_COMMUNITY): Payer: Self-pay | Admitting: Emergency Medicine

## 2016-09-17 ENCOUNTER — Emergency Department (HOSPITAL_COMMUNITY): Payer: No Typology Code available for payment source

## 2016-09-17 DIAGNOSIS — R0789 Other chest pain: Secondary | ICD-10-CM

## 2016-09-17 DIAGNOSIS — R000 Tachycardia, unspecified: Secondary | ICD-10-CM

## 2016-09-17 LAB — COMPREHENSIVE METABOLIC PANEL
ALK PHOS: 67 U/L (ref 38–126)
ALT: 15 U/L (ref 14–54)
AST: 20 U/L (ref 15–41)
Albumin: 4.1 g/dL (ref 3.5–5.0)
Anion gap: 6 (ref 5–15)
BILIRUBIN TOTAL: 0.6 mg/dL (ref 0.3–1.2)
BUN: 13 mg/dL (ref 6–20)
CALCIUM: 9.5 mg/dL (ref 8.9–10.3)
CO2: 28 mmol/L (ref 22–32)
CREATININE: 0.93 mg/dL (ref 0.44–1.00)
Chloride: 105 mmol/L (ref 101–111)
Glucose, Bld: 115 mg/dL — ABNORMAL HIGH (ref 65–99)
Potassium: 3.8 mmol/L (ref 3.5–5.1)
Sodium: 139 mmol/L (ref 135–145)
TOTAL PROTEIN: 7.6 g/dL (ref 6.5–8.1)

## 2016-09-17 LAB — CBC WITH DIFFERENTIAL/PLATELET
Basophils Absolute: 0 10*3/uL (ref 0.0–0.1)
Basophils Relative: 0 %
EOS PCT: 2 %
Eosinophils Absolute: 0.1 10*3/uL (ref 0.0–0.7)
HCT: 37.5 % (ref 36.0–46.0)
HEMOGLOBIN: 12.4 g/dL (ref 12.0–15.0)
LYMPHS ABS: 3.5 10*3/uL (ref 0.7–4.0)
LYMPHS PCT: 46 %
MCH: 28.4 pg (ref 26.0–34.0)
MCHC: 33.1 g/dL (ref 30.0–36.0)
MCV: 85.8 fL (ref 78.0–100.0)
MONOS PCT: 6 %
Monocytes Absolute: 0.4 10*3/uL (ref 0.1–1.0)
NEUTROS PCT: 46 %
Neutro Abs: 3.4 10*3/uL (ref 1.7–7.7)
Platelets: 273 10*3/uL (ref 150–400)
RBC: 4.37 MIL/uL (ref 3.87–5.11)
RDW: 13.6 % (ref 11.5–15.5)
WBC: 7.4 10*3/uL (ref 4.0–10.5)

## 2016-09-17 LAB — I-STAT TROPONIN, ED: Troponin i, poc: 0 ng/mL (ref 0.00–0.08)

## 2016-09-17 NOTE — ED Triage Notes (Signed)
Patient is complaining of chest on left side. Patient states she is having rapid heart beat everyday. Patient states it wakes her up out her sleep. Patient says that they usually go away but today they did not.

## 2016-09-17 NOTE — ED Provider Notes (Signed)
[facility]-EMERGENCY DEPT Provider Note   CSN: [id number] Arrival date & time: 09/17/16  2043  By signing my name below, I, Linna Darner, attest that this documentation has been prepared under the direction and in the presence of [facility], PA-C. Electronically Signed: Linna Darner, Scribe. 09/18/2016. 12:00 AM.  History   Chief Complaint Chief Complaint  Patient presents with  . Shortness of Breath  . Chest Pain    The history is provided by the patient. No language interpreter was used.     HPI Comments: Bailey Hicks is a 26 y.o. female who presents to the Emergency Department complaining of intermittent chest pain for one year, persistent and worse for the last 12 hours. She states she has had episodes of palpitations followed by chest pain and SOB for one year that most often occur just prior to going to sleep. Pt believes her symptoms may be associated with anxiety. Pt states her symptoms typically resolve on their own but this time her chest pain has persisted for the last twelve hours. Currently, she notes her SOB and palpitations have resolved. She has seen her PCP for the same and had an EKG performed that was unremarkable. No evaluations by cardiology. No recent travel, immobilizations or surgeries. No hormone therapy. She denies leg swelling, myalgias, or any other associated symptoms.  Past Medical History:  Diagnosis Date  . Anemia     Patient Active Problem List   Diagnosis Date Noted  . GERD without esophagitis 03/15/2014    Past Surgical History:  Procedure Laterality Date  . ANTERIOR CRUCIATE LIGAMENT REPAIR  Left knee  . WISDOM TOOTH EXTRACTION      OB History    Gravida Para Term Preterm AB Living   0   SAB TAB Ectopic Multiple Live Births   1     0         Home Medications    Prior to Admission medications   Medication Sig Start Date End Date Taking? Authorizing Provider  ibuprofen (ADVIL,MOTRIN) 800 MG tablet Take 1 tablet (800 mg  total) by mouth 3 (three) times daily. Patient not taking: Reported on 06/09/2016 01/23/16   Barrett Henle, PA-C    Family History Family History  Problem Relation Age of Onset  . Thyroid disease Mother   . Hypertension Other     Social History Social History  Substance Use Topics  . Smoking status: Never Smoker  . Smokeless tobacco: Never Used  . Alcohol use 0.0 oz/week     Comment: occ     Allergies   Patient has no known allergies.   Review of Systems Review of Systems  Respiratory: Positive for shortness of breath (resolved).   Cardiovascular: Positive for chest pain and palpitations (resolved). Negative for leg swelling.  Musculoskeletal: Negative for myalgias.  Psychiatric/Behavioral: The patient is nervous/anxious.   All other systems reviewed and are negative.  Physical Exam Updated Vital Signs BP 129/69 (BP Location: Left Arm)   Pulse 84   Temp 98.9 F (37.2 C) (Oral)   Resp 18   Ht  (1.626 m)   Wt 102.1 kg   LMP 09/08/2016   SpO2 100%   BMI 38.62 kg/m   Physical Exam  Constitutional: She is oriented to person, place, and time. She appears well-developed and well-nourished. No distress.  HENT:  Head: Normocephalic and atraumatic.  Cardiovascular: Normal rate, regular rhythm, normal heart sounds and intact distal pulses.   No  murmur heard. Pulmonary/Chest: Effort normal and breath sounds normal. No respiratory distress. She has no wheezes. She has no rales.  Abdominal: Soft. She exhibits no distension. There is no tenderness.  Musculoskeletal: She exhibits no edema.  Neurological: She is alert and oriented to person, place, and time.  Skin: Skin is warm and dry.  Nursing note and vitals reviewed.  ED Treatments / Results  Labs (all labs ordered are listed, but only abnormal results are displayed) Labs Reviewed  COMPREHENSIVE METABOLIC PANEL - Abnormal; Notable for the following:       Result Value   Glucose, Bld 115 (*)    All  other components within normal limits  CBC WITH DIFFERENTIAL/PLATELET  Rosezena Sensor, ED    EKG  EKG Interpretation  Date/Time:  Thursday September 17 2016 20:59:04 EDT Ventricular Rate:  82 PR Interval:    QRS Duration: 85 QT Interval:  381 QTC Calculation: 445 R Axis:   74 Text Interpretation:  Sinus rhythm Borderline T abnormalities, inferior leads Baseline wander in lead(s) V5 V6 agree. no sig change from old Confirmed by Donnald Garre, MD, Lebron Conners [id number]) on 09/17/2016 9:02:38 PM Also confirmed by Donnald Garre, MD, Lebron Conners [phone]), editor [place]  [facility], [place] ([id number])  on 09/18/2016 6:56:31 AM       Radiology Dg Chest 2 View  Result Date: 09/17/2016 CLINICAL DATA:  Chronic intermittent upper left chest pain and dyspnea, becoming constant today. EXAM: CHEST  2 VIEW COMPARISON:  08/02/2015 FINDINGS: The lungs are clear. The pulmonary vasculature is normal. Heart size is normal. Hilar and mediastinal contours are unremarkable. There is no pleural effusion. IMPRESSION: No active cardiopulmonary disease. Electronically Signed   By: Ellery Plunk M.D.   On: 09/17/2016 21:25    Procedures Procedures (including critical care time)  DIAGNOSTIC STUDIES: Oxygen Saturation is 100% on RA, normal by my interpretation.    Medications Ordered in ED Medications - No data to display   Initial Impression / Assessment and Plan / ED Course  I have reviewed the triage vital signs and the nursing notes.  Pertinent labs & imaging results that were available during my care of the patient were reviewed by me and considered in my medical decision making (see chart for details).    Bailey Hicks is a 26 y.o. female who presents to ED for intermittent chest pain x 1 year with today's episode beginning approximately 12 hours ago. Seen by PCP in the past with negative workup but never evaluated by cardiology. She describes feeling palpitations, chest pain and shortness of breath, however upon evaluation  palpitations and sob have resolved. Just chest pain currently. On exam, she is afebrile with normal heart rate. EKG unchanged from previous, non-ischemic. Low risk heart score. PERC negative. Troponin negative. Labs reassuring. CXR negative. Discussed that given symptoms off-and-on x 1 year with symptoms >12 hours today one troponin is very reassuring. Offered patient to stay and receive 2nd troponin but she declines. Evaluation does not show pathology that would require ongoing emergent intervention or inpatient treatment. Patient is hemodynamically stable and mentating appropriately. Will have patient follow up with cardiology for possible holter monitor. Reasons to return to ED discussed and all questions answered.    Final Clinical Impressions(s) / ED Diagnoses   Final diagnoses:  Chest wall pain  Racing heart beat    New Prescriptions Discharge Medication List as of 09/18/2016 12:27 AM     I personally performed the services described in this documentation, which was scribed in my presence. The  recorded information has been reviewed and is accurate.    [facility] Harlow Carrizales, PA-C 09/18/16 0930    Paula Libra, MD 09/18/16 1005

## 2016-09-18 NOTE — Discharge Instructions (Signed)
Please call the cardiology clinic listed in the morning to schedule a follow-up appointment. Return to ER for new or worsening symptoms, any concerns.

## 2016-12-02 ENCOUNTER — Inpatient Hospital Stay (HOSPITAL_COMMUNITY)
Admission: AD | Admit: 2016-12-02 | Discharge: 2016-12-02 | Disposition: A | Discharge status: Home / Self Care | Payer: No Typology Code available for payment source | Source: Ambulatory Visit | Attending: Obstetrics and Gynecology | Admitting: Obstetrics and Gynecology

## 2016-12-02 DIAGNOSIS — Z9889 Other specified postprocedural states: Secondary | ICD-10-CM | POA: Insufficient documentation

## 2016-12-02 DIAGNOSIS — D649 Anemia, unspecified: Secondary | ICD-10-CM | POA: Insufficient documentation

## 2016-12-02 DIAGNOSIS — R590 Localized enlarged lymph nodes: Secondary | ICD-10-CM | POA: Insufficient documentation

## 2016-12-02 DIAGNOSIS — Z8249 Family history of ischemic heart disease and other diseases of the circulatory system: Secondary | ICD-10-CM | POA: Insufficient documentation

## 2016-12-02 DIAGNOSIS — Z8349 Family history of other endocrine, nutritional and metabolic diseases: Secondary | ICD-10-CM | POA: Insufficient documentation

## 2016-12-02 DIAGNOSIS — Z79899 Other long term (current) drug therapy: Secondary | ICD-10-CM | POA: Insufficient documentation

## 2016-12-02 DIAGNOSIS — R599 Enlarged lymph nodes, unspecified: Secondary | ICD-10-CM

## 2016-12-02 DIAGNOSIS — M79621 Pain in right upper arm: Secondary | ICD-10-CM | POA: Insufficient documentation

## 2016-12-02 LAB — POCT PREGNANCY, URINE: PREG TEST UR: NEGATIVE

## 2016-12-02 NOTE — MAU Note (Signed)
Provider examined and spoke with patient. Discharged instructions given and reviewed. Patient discharged.

## 2016-12-02 NOTE — MAU Note (Signed)
Right under arm painful and tender to touch since Thursday.  Feels something the size of a bean at underarm.  Not running a fever.  Not pregnant.

## 2016-12-02 NOTE — Discharge Instructions (Signed)
Lymphadenopathy Lymphadenopathy refers to swollen or enlarged lymph glands, also called lymph nodes. Lymph glands are part of your body's defense (immune) system, which protects the body from infections, germs, and diseases. Lymph glands are found in many locations in your body, including the neck, underarm, and groin. Many things can cause lymph glands to become enlarged. When your immune system responds to germs, such as viruses or bacteria, infection-fighting cells and fluid build up. This causes the glands to grow in size. Usually, this is not something to worry about. The swelling and any soreness often go away without treatment. However, swollen lymph glands can also be caused by a number of diseases. Your health care provider may do various tests to help determine the cause. If the cause of your swollen lymph glands cannot be found, it is important to monitor your condition to make sure the swelling goes away. Follow these instructions at home: Watch your condition for any changes. The following actions may help to lessen any discomfort you are feeling:  Get plenty of rest.  Take medicines only as directed by your health care provider. Your health care provider may recommend over-the-counter medicines for pain.  Apply moist heat compresses to the site of swollen lymph nodes as directed by your health care provider. This can help reduce any pain.  Check your lymph nodes daily for any changes.  Keep all follow-up visits as directed by your health care provider. This is important.  Contact a health care provider if:  Your lymph nodes are still swollen after 2 weeks.  Your swelling increases or spreads to other areas.  Your lymph nodes are hard, seem fixed to the skin, or are growing rapidly.  Your skin over the lymph nodes is red and inflamed.  You have a fever.  You have chills.  You have fatigue.  You develop a sore throat.  You have abdominal pain.  You have weight  loss.  You have night sweats. Get help right away if:  You notice fluid leaking from the area of the enlarged lymph node.  You have severe pain in any area of your body.  You have chest pain.  You have shortness of breath. This information is not intended to replace advice given to you by your health care provider. Make sure you discuss any questions you have with your health care provider. Document Released: 03/10/2008 Document Revised: 11/07/2015 Document Reviewed: 01/04/2014 Elsevier Interactive Patient Education  2018 Elsevier Inc.  

## 2016-12-02 NOTE — MAU Provider Note (Signed)
History     CSN: [id number]  Arrival date and time: 12/02/16 2041   None     Chief Complaint  Patient presents with  . Arm Pain   Patient is a 26 year old G2 P0 who presents today with concerns of a swollen lymph node under her right axilla. She reports his been present for approximately 5 days. She reports no surrounding erythema and no fevers or chills. She reports is mildly tender to palpation. She reports no injury to the area no recent abscess or discharge from the area. She has no other systemic symptoms. And denies any abnormal lumps or bumps in the breast. She has reports she had an infection and gland around her areola approximately 1 year ago and had an ultrasound performed at the breast center which she said was normal.    OB History    Gravida Para Term Preterm AB Living   2 1     1  0   SAB TAB Ectopic Multiple Live Births   1     0        Past Medical History:  Diagnosis Date  . Anemia     Past Surgical History:  Procedure Laterality Date  . ANTERIOR CRUCIATE LIGAMENT REPAIR  Left knee  . WISDOM TOOTH EXTRACTION      Family History  Problem Relation Age of Onset  . Thyroid disease Mother   . Hypertension Other     Social History  Substance Use Topics  . Smoking status: Never Smoker  . Smokeless tobacco: Never Used  . Alcohol use 0.0 oz/week     Comment: occ    Allergies: No Known Allergies  Prescriptions Prior to Admission  Medication Sig Dispense Refill Last Dose  . ibuprofen (ADVIL,MOTRIN) 800 MG tablet Take 1 tablet (800 mg total) by mouth 3 (three) times daily. (Patient not taking: Reported on 06/09/2016) 21 tablet 0 Not Taking at Unknown time    Review of Systems  Constitutional: Negative for chills and fever.  HENT: Negative for congestion and rhinorrhea.   Respiratory: Negative for cough and shortness of breath.   Cardiovascular: Negative for chest pain and palpitations.  Gastrointestinal: Negative for abdominal distention and  vomiting.  Genitourinary: Negative for dysuria, frequency and urgency.  Musculoskeletal: Negative for arthralgias, back pain and joint swelling.  Neurological: Negative for dizziness and weakness.   Physical Exam   Blood pressure (!) 144/78, pulse 85, temperature 98.3 F (36.8 C), temperature source Oral, resp. rate 18, height 5\' 4"  (1.626 m), weight 232 lb 12 oz (105.6 kg), last menstrual period 11/03/2016, SpO2 99 %.  Physical Exam  Vitals reviewed. Constitutional: She is oriented to person, place, and time. She appears well-developed and well-nourished.  Cardiovascular: Normal rate and intact distal pulses.   Respiratory: Effort normal. No respiratory distress.  Right axilla had a small half centimeter by half centimeter palpable abnormality in the inferior aspect of the axilla. No surrounding erythema no draining tract to the skin. Minimally tender. Right breast exam was performed which revealed fibrous breasts but no palpable abnormality. Nipple piercing was noted. No nipple discharge noted.  Neurological: She is alert and oriented to person, place, and time.  Skin: Skin is warm and dry.  Psychiatric: She has a normal mood and affect.    MAU Course  Procedures  MDM In MA U patient underwent medical screening and a breast exam. All of which were unremarkable. Pregnancy test was also performed and was negative  Assessment and Plan  #1:  Enlarged lymph node in the right axilla: No concerning findings on breast exam or the skin surrounding the area. Advised she can go on vacation she does the toilet swimming does not need antibiotics at this point. Affect her symptoms do not improve in 2-3 weeks may need further workup.  Ernestina Pennaicholas Diahann Guajardo 12/02/2016, 9:13 PM

## 2016-12-15 ENCOUNTER — Other Ambulatory Visit (HOSPITAL_COMMUNITY): Payer: Self-pay | Admitting: *Deleted

## 2016-12-15 DIAGNOSIS — R2231 Localized swelling, mass and lump, right upper limb: Secondary | ICD-10-CM

## 2016-12-17 ENCOUNTER — Inpatient Hospital Stay (HOSPITAL_COMMUNITY): Payer: Self-pay

## 2016-12-17 ENCOUNTER — Encounter (HOSPITAL_COMMUNITY): Payer: Self-pay | Admitting: *Deleted

## 2016-12-17 ENCOUNTER — Inpatient Hospital Stay (HOSPITAL_COMMUNITY)
Admission: AD | Admit: 2016-12-17 | Discharge: 2016-12-17 | Discharge status: Against Medical Advice | Payer: Self-pay | Source: Ambulatory Visit | Attending: Obstetrics and Gynecology | Admitting: Obstetrics and Gynecology

## 2016-12-17 ENCOUNTER — Inpatient Hospital Stay (HOSPITAL_COMMUNITY)
Admission: AD | Admit: 2016-12-17 | Discharge: 2016-12-18 | Disposition: A | Discharge status: Home / Self Care | Payer: Self-pay | Source: Ambulatory Visit | Attending: Obstetrics and Gynecology | Admitting: Obstetrics and Gynecology

## 2016-12-17 DIAGNOSIS — Z5321 Procedure and treatment not carried out due to patient leaving prior to being seen by health care provider: Secondary | ICD-10-CM | POA: Insufficient documentation

## 2016-12-17 DIAGNOSIS — O98811 Other maternal infectious and parasitic diseases complicating pregnancy, first trimester: Secondary | ICD-10-CM | POA: Insufficient documentation

## 2016-12-17 DIAGNOSIS — O4691 Antepartum hemorrhage, unspecified, first trimester: Secondary | ICD-10-CM | POA: Insufficient documentation

## 2016-12-17 DIAGNOSIS — O26899 Other specified pregnancy related conditions, unspecified trimester: Secondary | ICD-10-CM

## 2016-12-17 DIAGNOSIS — B3731 Acute candidiasis of vulva and vagina: Secondary | ICD-10-CM

## 2016-12-17 DIAGNOSIS — Z3A01 Less than 8 weeks gestation of pregnancy: Secondary | ICD-10-CM | POA: Insufficient documentation

## 2016-12-17 DIAGNOSIS — B373 Candidiasis of vulva and vagina: Secondary | ICD-10-CM | POA: Insufficient documentation

## 2016-12-17 DIAGNOSIS — R109 Unspecified abdominal pain: Secondary | ICD-10-CM | POA: Insufficient documentation

## 2016-12-17 DIAGNOSIS — O209 Hemorrhage in early pregnancy, unspecified: Secondary | ICD-10-CM

## 2016-12-17 LAB — CBC WITH DIFFERENTIAL/PLATELET
BASOS PCT: 0 %
Basophils Absolute: 0 10*3/uL (ref 0.0–0.1)
EOS ABS: 0.1 10*3/uL (ref 0.0–0.7)
Eosinophils Relative: 1 %
HCT: 38.8 % (ref 36.0–46.0)
HEMOGLOBIN: 12.8 g/dL (ref 12.0–15.0)
Lymphocytes Relative: 42 %
Lymphs Abs: 3.3 10*3/uL (ref 0.7–4.0)
MCH: 29.2 pg (ref 26.0–34.0)
MCHC: 33 g/dL (ref 30.0–36.0)
MCV: 88.6 fL (ref 78.0–100.0)
MONO ABS: 0.3 10*3/uL (ref 0.1–1.0)
MONOS PCT: 4 %
NEUTROS PCT: 53 %
Neutro Abs: 4 10*3/uL (ref 1.7–7.7)
Platelets: 272 10*3/uL (ref 150–400)
RBC: 4.38 MIL/uL (ref 3.87–5.11)
RDW: 14.2 % (ref 11.5–15.5)
WBC: 7.7 10*3/uL (ref 4.0–10.5)

## 2016-12-17 LAB — URINALYSIS, ROUTINE W REFLEX MICROSCOPIC
Bilirubin Urine: NEGATIVE
GLUCOSE, UA: NEGATIVE mg/dL
KETONES UR: NEGATIVE mg/dL
Nitrite: NEGATIVE
PROTEIN: NEGATIVE mg/dL
Specific Gravity, Urine: 1.017 (ref 1.005–1.030)
pH: 5 (ref 5.0–8.0)

## 2016-12-17 LAB — HCG, QUANTITATIVE, PREGNANCY: HCG, BETA CHAIN, QUANT, S: 8701 m[IU]/mL — AB (ref <5)

## 2016-12-17 LAB — POCT PREGNANCY, URINE: PREG TEST UR: POSITIVE — AB

## 2016-12-17 NOTE — MAU Note (Signed)
PT WAS HERE EARLIER - WHEN CALLED FROM LOBBY - NOT THERE X3 CALLS   -- SAYS SHE WAS IN PARKING LOT WAITING ON HUSBAND.    SHE WENT TO  B-ROOM  AT 2300 - WIPED - SAW A TINY CLOT.

## 2016-12-17 NOTE — MAU Note (Signed)
Pt not in lobby.  

## 2016-12-17 NOTE — MAU Note (Signed)
PT  SAYS SHE HAS LOWER ABD PRESSURE   THAT RADIATES  TO HER BACK- ACHING-  NO MEDS  - STARTED  Tuesday NIGHT .   Tuesday HPT- POSITIVE.   LAST SEX-   June 8.    NO BIRTH CONTROL

## 2016-12-17 NOTE — MAU Note (Signed)
Pt not in lobby x1 

## 2016-12-17 NOTE — MAU Note (Signed)
Pt called, not in lobby 

## 2016-12-18 ENCOUNTER — Encounter (HOSPITAL_COMMUNITY): Payer: Self-pay | Admitting: *Deleted

## 2016-12-18 DIAGNOSIS — O209 Hemorrhage in early pregnancy, unspecified: Secondary | ICD-10-CM

## 2016-12-18 LAB — RPR: RPR Ser Ql: NONREACTIVE

## 2016-12-18 LAB — HIV ANTIBODY (ROUTINE TESTING W REFLEX): HIV Screen 4th Generation wRfx: NONREACTIVE

## 2016-12-18 MED ORDER — TERCONAZOLE 0.8 % VA CREA: 1.0000 | TOPICAL_CREAM | Freq: Every day | VAGINAL | 0 refills | Status: DC

## 2016-12-18 NOTE — Discharge Instructions (Signed)
Vaginal Bleeding During Pregnancy, First Trimester A small amount of bleeding (spotting) from the vagina is relatively common in early pregnancy. It usually stops on its own. Various things may cause bleeding or spotting in early pregnancy. Some bleeding may be related to the pregnancy, and some may not. In most cases, the bleeding is normal and is not a problem. However, bleeding can also be a sign of something serious. Be sure to tell your health care provider about any vaginal bleeding right away. Some possible causes of vaginal bleeding during the first trimester include:  Infection or inflammation of the cervix.  Growths (polyps) on the cervix.  Miscarriage or threatened miscarriage.  Pregnancy tissue has developed outside of the uterus and in a fallopian tube (tubal pregnancy).  Tiny cysts have developed in the uterus instead of pregnancy tissue (molar pregnancy).  Follow these instructions at home: Watch your condition for any changes. The following actions may help to lessen any discomfort you are feeling:  Follow your health care provider's instructions for limiting your activity. If your health care provider orders bed rest, you may need to stay in bed and only get up to use the bathroom. However, your health care provider may allow you to continue light activity.  If needed, make plans for someone to help with your regular activities and responsibilities while you are on bed rest.  Keep track of the number of pads you use each day, how often you change pads, and how soaked (saturated) they are. Write this down.  Do not use tampons. Do not douche.  Do not have sexual intercourse or orgasms until approved by your health care provider.  If you pass any tissue from your vagina, save the tissue so you can show it to your health care provider.  Only take over-the-counter or prescription medicines as directed by your health care provider.  Do not take aspirin because it can make  you bleed.  Keep all follow-up appointments as directed by your health care provider.  Contact a health care provider if:  You have any vaginal bleeding during any part of your pregnancy.  You have cramps or labor pains.  You have a fever, not controlled by medicine. Get help right away if:  You have severe cramps in your back or belly (abdomen).  You pass large clots or tissue from your vagina.  Your bleeding increases.  You feel light-headed or weak, or you have fainting episodes.  You have chills.  You are leaking fluid or have a gush of fluid from your vagina.  You pass out while having a bowel movement. This information is not intended to replace advice given to you by your health care provider. Make sure you discuss any questions you have with your health care provider. Document Released: 03/11/2005 Document Revised: 11/07/2015 Document Reviewed: 02/06/2013 Elsevier Interactive Patient Education  2018 Elsevier Inc.   Pelvic Rest Pelvic rest may be recommended if:  Your placenta is partially or completely covering the opening of your cervix (placenta previa).  There is bleeding between the wall of the uterus and the amniotic sac in the first trimester of pregnancy (subchorionic hemorrhage).  You went into labor too early (preterm labor).  Based on your overall health and the health of your baby, your health care provider will decide if pelvic rest is right for you. How do I rest my pelvis? For as long as told by your health care provider:  Do not have sex, sexual stimulation, or an orgasm.  Do not use tampons. Do not douche. Do not put anything in your vagina.  Do not lift anything that is heavier than 10 lb (4.5 kg).  Avoid activities that take a lot of effort (are strenuous).  Avoid any activity in which your pelvic muscles could become strained.  When should I seek medical care? Seek medical care if you have:  Cramping pain in your lower  abdomen.  Vaginal discharge.  A low, dull backache.  Regular contractions.  Uterine tightening.  When should I seek immediate medical care? Seek immediate medical care if:  You have vaginal bleeding and you are pregnant.  This information is not intended to replace advice given to you by your health care provider. Make sure you discuss any questions you have with your health care provider. Document Released: 09/26/2010 Document Revised: 11/07/2015 Document Reviewed: 12/03/2014 Elsevier Interactive Patient Education  2018 [employer].   Vaginal Yeast infection, Adult Vaginal yeast infection is a condition that causes soreness, swelling, and redness (inflammation) of the vagina. It also causes vaginal discharge. This is a common condition. Some women get this infection frequently. What are the causes? This condition is caused by a change in the normal balance of the yeast (candida) and bacteria that live in the vagina. This change causes an overgrowth of yeast, which causes the inflammation. What increases the risk? This condition is more likely to develop in:  Women who take antibiotic medicines.  Women who have diabetes.  Women who take birth control pills.  Women who are pregnant.  Women who douche often.  Women who have a weak defense (immune) system.  Women who have been taking steroid medicines for a long time.  Women who frequently wear tight clothing.  What are the signs or symptoms? Symptoms of this condition include:  White, thick vaginal discharge.  Swelling, itching, redness, and irritation of the vagina. The lips of the vagina (vulva) may be affected as well.  Pain or a burning feeling while urinating.  Pain during sex.  How is this diagnosed? This condition is diagnosed with a medical history and physical exam. This will include a pelvic exam. Your health care provider will examine a sample of your vaginal discharge under a microscope. Your health  care provider may send this sample for testing to confirm the diagnosis. How is this treated? This condition is treated with medicine. Medicines may be over-the-counter or prescription. You may be told to use one or more of the following:  Medicine that is taken orally.  Medicine that is applied as a cream.  Medicine that is inserted directly into the vagina (suppository).  Follow these instructions at home:  Take or apply over-the-counter and prescription medicines only as told by your health care provider.  Do not have sex until your health care provider has approved. Tell your sex partner that you have a yeast infection. That person should go to his or her health care provider if he or she develops symptoms.  Do not wear tight clothes, such as pantyhose or tight pants.  Avoid using tampons until your health care provider approves.  Eat more yogurt. This may help to keep your yeast infection from returning.  Try taking a sitz bath to help with discomfort. This is a warm water bath that is taken while you are sitting down. The water should only come up to your hips and should cover your buttocks. Do this 3-4 times per day or as told by your health care provider.  Do not douche.  Wear breathable, cotton underwear.  If you have diabetes, keep your blood sugar levels under control. Contact a health care provider if:  You have a fever.  Your symptoms go away and then return.  Your symptoms do not get better with treatment.  Your symptoms get worse.  You have new symptoms.  You develop blisters in or around your vagina.  You have blood coming from your vagina and it is not your menstrual period.  You develop pain in your abdomen. This information is not intended to replace advice given to you by your health care provider. Make sure you discuss any questions you have with your health care provider. Document Released: 03/11/2005 Document Revised: 11/13/2015 Document Reviewed:  12/03/2014 Elsevier Interactive Patient Education  2018 [employer]

## 2016-12-18 NOTE — MAU Provider Note (Signed)
History     CSN: [id number]  Arrival date and time: 12/17/16 2320   First Provider Initiated Contact with Patient 12/18/16 [phone]      Chief Complaint  Patient presents with  . Abdominal Pain  . Vaginal Bleeding   HPI [name] is a 26 y.o. G2P0000 at [redacted]w[redacted]d who presents to MAU today with complaint of light bleeding noted today with one small clot. She denies pain, fever, UTI symptoms, vomiting, diarrhea or constipation. She has had mild, intermittent nausea. LMP 11/03/16 and +HPT recently.   OB History    Gravida Para Term Preterm AB Living   2 1     0 0   SAB TAB Ectopic Multiple Live Births   0     0        Past Medical History:  Diagnosis Date  . Anemia     Past Surgical History:  Procedure Laterality Date  . ANTERIOR CRUCIATE LIGAMENT REPAIR  Left knee  . WISDOM TOOTH EXTRACTION      Family History  Problem Relation Age of Onset  . Thyroid disease Mother   . Hypertension Other     Social History  Substance Use Topics  . Smoking status: Never Smoker  . Smokeless tobacco: Never Used  . Alcohol use 0.0 oz/week     Comment: not while preg    Allergies: No Known Allergies  Prescriptions Prior to Admission  Medication Sig Dispense Refill Last Dose  . prenatal vitamin w/FE, FA (PRENATAL 1 + 1) 27-1 MG TABS tablet Take 1 tablet by mouth daily at 12 noon.   12/17/2016 at Unknown time  . ibuprofen (ADVIL,MOTRIN) 800 MG tablet Take 1 tablet (800 mg total) by mouth 3 (three) times daily. (Patient not taking: Reported on 06/09/2016) 21 tablet 0 More than a month at Unknown time    Review of Systems  Constitutional: Negative for fever.  Gastrointestinal: Positive for nausea. Negative for abdominal pain, constipation, diarrhea and vomiting.  Genitourinary: Positive for vaginal bleeding. Negative for dysuria, frequency, urgency and vaginal discharge.   Physical Exam   Blood pressure 138/73, pulse 94, last menstrual period 11/03/2016.  Physical Exam  Nursing  note and vitals reviewed. Constitutional: She is oriented to person, place, and time. She appears well-developed and well-nourished. No distress.  HENT:  Head: Normocephalic and atraumatic.  Cardiovascular: Normal rate.   Respiratory: Effort normal.  GI: Soft. She exhibits no distension and no mass. There is no tenderness. There is no rebound and no guarding.  Genitourinary: Uterus is not enlarged and not tender. Cervix exhibits no motion tenderness, no discharge and no friability. Right adnexum displays no mass and no tenderness. Left adnexum displays no mass and no tenderness. There is bleeding (scant, mostly brown) in the vagina. Vaginal discharge (small amount of clumpy yellow discharge) found.  Neurological: She is alert and oriented to person, place, and time.  Skin: Skin is warm and dry. No erythema.  Psychiatric: She has a normal mood and affect.  Dilation: Closed Effacement (%): Thick Cervical Position: Posterior Exam by:: Vonzella Nipple, PA-C   Results for orders placed or performed during the hospital encounter of 12/17/16 (from the past 24 hour(s))  Urinalysis, Routine w reflex microscopic     Status: Abnormal   Collection Time: 12/17/16  8:08 PM  Result Value Ref Range   Color, Urine YELLOW YELLOW   APPearance HAZY (A) CLEAR   Specific Gravity, Urine 1.017 1.005 - 1.030   pH 5.0 5.0 - 8.0  Glucose, UA NEGATIVE NEGATIVE mg/dL   Hgb urine dipstick MODERATE (A) NEGATIVE   Bilirubin Urine NEGATIVE NEGATIVE   Ketones, ur NEGATIVE NEGATIVE mg/dL   Protein, ur NEGATIVE NEGATIVE mg/dL   Nitrite NEGATIVE NEGATIVE   Leukocytes, UA MODERATE (A) NEGATIVE   RBC / HPF 0-5 0 - 5 RBC/hpf   WBC, UA 0-5 0 - 5 WBC/hpf   Bacteria, UA RARE (A) NONE SEEN   Squamous Epithelial / LPF 6-30 (A) NONE SEEN  Pregnancy, urine POC     Status: Abnormal   Collection Time: 12/17/16  8:12 PM  Result Value Ref Range   Preg Test, Ur POSITIVE (A) NEGATIVE  CBC with Differential/Platelet     Status:  None   Collection Time: 12/17/16  8:42 PM  Result Value Ref Range   WBC 7.7 4.0 - 10.5 K/uL   RBC 4.38 3.87 - 5.11 MIL/uL   Hemoglobin 12.8 12.0 - 15.0 g/dL   HCT 82.938.8 56.236.0 - 13.046.0 %   MCV 88.6 78.0 - 100.0 fL   MCH 29.2 26.0 - 34.0 pg   MCHC 33.0 30.0 - 36.0 g/dL   RDW 86.514.2 78.411.5 - 69.615.5 %   Platelets 272 150 - 400 K/uL   Neutrophils Relative % 53 %   Neutro Abs 4.0 1.7 - 7.7 K/uL   Lymphocytes Relative 42 %   Lymphs Abs 3.3 0.7 - 4.0 K/uL   Monocytes Relative 4 %   Monocytes Absolute 0.3 0.1 - 1.0 K/uL   Eosinophils Relative 1 %   Eosinophils Absolute 0.1 0.0 - 0.7 K/uL   Basophils Relative 0 %   Basophils Absolute 0.0 0.0 - 0.1 K/uL  hCG, quantitative, pregnancy     Status: Abnormal   Collection Time: 12/17/16  8:42 PM  Result Value Ref Range   hCG, Beta Chain, Quant, S 8,701 (H) <5 mIU/mL   [place] Ob Comp Less 14 Wks  Result Date: 12/18/2016 CLINICAL DATA:  Acute onset of vaginal spotting.  Initial encounter. EXAM: OBSTETRIC <14 WK US AND TRANSVAGINAL OB US TECHNIQUE: Both transabdominal and transvaginal ultrasound examinations were performed for complete evaluation of the gestation as well as the maternal uterus, adnexal regions, and pelvic cul-de-sac. Transvaginal technique was performed to assess early pregnancy. COMPARISON:  None. FINDINGS: Intrauterine gestational sac: Single; visualized and normal in shape. Yolk sac:  Yes Embryo:  No Cardiac Activity: N/A MSD: 6.6  mm   5 w   2  d Subchorionic hemorrhage:  None visualized. Maternal uterus/adnexae: The uterus is otherwise unremarkable in appearance. A small amount of complex fluid is noted at the cervical canal. The ovaries are within normal limits. The right ovary measures 3.2 x 1.8 x 1.6 cm, while the left ovary measures 4.2 x 3.2 x 2.7 cm. No suspicious adnexal masses are seen; there is evidence for ovarian torsion. No free fluid is seen within the pelvic cul-de-sac. IMPRESSION: 1. Single intrauterine gestational sac noted, with a  mean sac diameter of 7 mm, corresponding to a gestational age of [redacted] weeks 2 days. This matches the gestational age of [redacted] weeks 2 days by LMP, reflecting an estimated date of delivery of August 10, 2017. A yolk sac is seen. No embryo is yet visualized, within normal limits. 2. Small amount of complex fluid noted at the cervical canal, likely reflecting clot. Electronically Signed   By: Roanna RaiderJeffery  Chang M.D.   On: 12/18/2016 00:37   [place] Ob Transvaginal  Result Date: 12/18/2016 CLINICAL DATA:  Acute onset of vaginal  spotting.  Initial encounter. EXAM: OBSTETRIC <14 WK [place] AND TRANSVAGINAL OB US TECHNIQUE: Both transabdominal and transvaginal ultrasound examinations were performed for complete evaluation of the gestation as well as the maternal uterus, adnexal regions, and pelvic cul-de-sac. Transvaginal technique was performed to assess early pregnancy. COMPARISON:  None. FINDINGS: Intrauterine gestational sac: Single; visualized and normal in shape. Yolk sac:  Yes Embryo:  No Cardiac Activity: N/A MSD: 6.6  mm   5 w   2  d Subchorionic hemorrhage:  None visualized. Maternal uterus/adnexae: The uterus is otherwise unremarkable in appearance. A small amount of complex fluid is noted at the cervical canal. The ovaries are within normal limits. The right ovary measures 3.2 x 1.8 x 1.6 cm, while the left ovary measures 4.2 x 3.2 x 2.7 cm. No suspicious adnexal masses are seen; there is evidence for ovarian torsion. No free fluid is seen within the pelvic cul-de-sac. IMPRESSION: 1. Single intrauterine gestational sac noted, with a mean sac diameter of 7 mm, corresponding to a gestational age of [redacted] weeks 2 days. This matches the gestational age of [redacted] weeks 2 days by LMP, reflecting an estimated date of delivery of August 10, 2017. A yolk sac is seen. No embryo is yet visualized, within normal limits. 2. Small amount of complex fluid noted at the cervical canal, likely reflecting clot. Electronically Signed   By: Roanna Raider M.D.   On: 12/18/2016 00:37    MAU Course  Procedures None  MDM +UPT UA, wet prep, GC/chlamydia, CBC, quant hCG, HIV, RPR and [place] today to rule out ectopic pregnancy  Assessment and Plan  A: SIUP at [redacted]w[redacted]d Vaginal bleeding  Yeast vulvovaginitis, clinical   P: Discharge home Rx for Terazol cream given  Bleeding precautions discussed Patient advised to follow-up with [facility] as planned to start prenatal care  Patient may return to MAU as needed or if her condition were to change or worsen   Vonzella Nipple, PA-C 12/18/2016, 1:37 AM

## 2016-12-19 LAB — CULTURE, OB URINE

## 2016-12-20 ENCOUNTER — Other Ambulatory Visit: Payer: Self-pay | Admitting: Student

## 2016-12-20 DIAGNOSIS — R8271 Bacteriuria: Secondary | ICD-10-CM

## 2016-12-20 DIAGNOSIS — O9989 Other specified diseases and conditions complicating pregnancy, childbirth and the puerperium: Principal | ICD-10-CM

## 2016-12-20 DIAGNOSIS — O99891 Other specified diseases and conditions complicating pregnancy: Secondary | ICD-10-CM

## 2016-12-21 ENCOUNTER — Encounter (HOSPITAL_COMMUNITY): Payer: Self-pay | Admitting: *Deleted

## 2016-12-21 ENCOUNTER — Inpatient Hospital Stay (HOSPITAL_COMMUNITY)
Admission: AD | Admit: 2016-12-21 | Discharge: 2016-12-21 | Disposition: A | Discharge status: Home / Self Care | Payer: Self-pay | Source: Ambulatory Visit | Attending: Family Medicine | Admitting: Family Medicine

## 2016-12-21 DIAGNOSIS — O209 Hemorrhage in early pregnancy, unspecified: Secondary | ICD-10-CM | POA: Insufficient documentation

## 2016-12-21 DIAGNOSIS — Z8349 Family history of other endocrine, nutritional and metabolic diseases: Secondary | ICD-10-CM | POA: Insufficient documentation

## 2016-12-21 DIAGNOSIS — Z3A01 Less than 8 weeks gestation of pregnancy: Secondary | ICD-10-CM | POA: Insufficient documentation

## 2016-12-21 HISTORY — DX: Chlamydial infection, unspecified: A74.9

## 2016-12-21 HISTORY — DX: Gonococcal infection, unspecified: A54.9

## 2016-12-21 HISTORY — DX: Unspecified ovarian cyst, unspecified side: N83.209

## 2016-12-21 HISTORY — DX: Unspecified infectious disease: B99.9

## 2016-12-21 LAB — URINALYSIS, ROUTINE W REFLEX MICROSCOPIC
Bacteria, UA: NONE SEEN
Bilirubin Urine: NEGATIVE
Glucose, UA: NEGATIVE mg/dL
KETONES UR: NEGATIVE mg/dL
Leukocytes, UA: NEGATIVE
NITRITE: NEGATIVE
PROTEIN: NEGATIVE mg/dL
Specific Gravity, Urine: 1.027 (ref 1.005–1.030)
pH: 5 (ref 5.0–8.0)

## 2016-12-21 LAB — WET PREP, GENITAL
Clue Cells Wet Prep HPF POC: NONE SEEN
SPERM: NONE SEEN
Trich, Wet Prep: NONE SEEN
YEAST WET PREP: NONE SEEN

## 2016-12-21 NOTE — MAU Provider Note (Signed)
History     CSN: [id number]  Arrival date and time: 12/21/16 7829   First Provider Initiated Contact with Patient 12/21/16 0930      Chief Complaint  Patient presents with  . Vaginal Bleeding   HPI   [name] is a 26 y.o. female G2P0000 @ [redacted]w[redacted]d here in MAU with mucus and vaginal spotting. The spotting started 4 days ago. She notices the spotting every day and she notices it only when wipes. Last intercourse was 1 month ago. This is new onset.   Patient was seen on on 7/6 and had an [place] that showed an IUP.   OB History    Gravida Para Term Preterm AB Living   2 1     0 0   SAB TAB Ectopic Multiple Live Births   0     0 0      Past Medical History:  Diagnosis Date  . Anemia   . Chlamydia 2013  . Gonorrhea 2013  . Infection    UTI  . Ovarian cyst     Past Surgical History:  Procedure Laterality Date  . ANTERIOR CRUCIATE LIGAMENT REPAIR Right   . WISDOM TOOTH EXTRACTION      Family History  Problem Relation Age of Onset  . Thyroid disease Mother   . Hypertension Other     Social History  Substance Use Topics  . Smoking status: Never Smoker  . Smokeless tobacco: Never Used  . Alcohol use 0.0 oz/week     Comment: not while preg    Allergies: No Known Allergies  Prescriptions Prior to Admission  Medication Sig Dispense Refill Last Dose  . prenatal vitamin w/FE, FA (PRENATAL 1 + 1) 27-1 MG TABS tablet Take 1 tablet by mouth daily at 12 noon.   12/21/2016 at Unknown time  . terconazole (TERAZOL 3) 0.8 % vaginal cream Place 1 applicator vaginally at bedtime. (Patient not taking: Reported on 12/21/2016) 20 g 0 Completed Course at Unknown time   Results for orders placed or performed during the hospital encounter of 12/21/16 (from the past 48 hour(s))  Urinalysis, Routine w reflex microscopic     Status: Abnormal   Collection Time: 12/21/16  8:00 AM  Result Value Ref Range   Color, Urine YELLOW YELLOW   APPearance CLEAR CLEAR   Specific Gravity, Urine  1.027 1.005 - 1.030   pH 5.0 5.0 - 8.0   Glucose, UA NEGATIVE NEGATIVE mg/dL   Hgb urine dipstick SMALL (A) NEGATIVE   Bilirubin Urine NEGATIVE NEGATIVE   Ketones, ur NEGATIVE NEGATIVE mg/dL   Protein, ur NEGATIVE NEGATIVE mg/dL   Nitrite NEGATIVE NEGATIVE   Leukocytes, UA NEGATIVE NEGATIVE   RBC / HPF 0-5 0 - 5 RBC/hpf   WBC, UA 0-5 0 - 5 WBC/hpf   Bacteria, UA NONE SEEN NONE SEEN   Squamous Epithelial / LPF 0-5 (A) NONE SEEN   Mucous PRESENT     Review of Systems  Constitutional: Negative for fever.  Gastrointestinal: Positive for abdominal pain (Gas pain. ).  Genitourinary: Positive for vaginal bleeding and vaginal discharge. Negative for dysuria.   Physical Exam   Blood pressure 137/73, pulse 88, temperature 98.9 F (37.2 C), temperature source Oral, resp. rate 18, weight 231 lb 12 oz (105.1 kg), last menstrual period 11/03/2016.  Physical Exam  Constitutional: She is oriented to person, place, and time. She appears well-developed and well-nourished. No distress.  HENT:  Head: Normocephalic.  Eyes: Pupils are equal, round, and reactive to  light.  Genitourinary:  Genitourinary Comments: Vagina - Small amount of thick vaginal discharge, streaks of red blood noted,  no odor  Cervix - No contact bleeding, no active bleeding  Bimanual exam: Cervix closed, anterior  Uterus non tender, enlarged  GC/Chlam, wet prep done Chaperone present for exam.   Musculoskeletal: Normal range of motion.  Neurological: She is alert and oriented to person, place, and time.  Skin: Skin is warm. She is not diaphoretic.  Psychiatric: Her behavior is normal.    MAU Course  Procedures  None  MDM  O positive blood type. Wet prep and GC collected UA US from 7/6 fully reviewed.   Assessment and Plan   A:  1. Vaginal bleeding in pregnancy, first trimester     P:  Discharge home in stable condition US scheduled for 7/11 for viability, new onset vaginal bleeding. Patient to go to  the [facility] following [facility] for results. Message sent to the [facility].  Bleeding precautions Return to MAU if symptoms worsen Start prenatal care Pelvic rest.   Duane Lopeasch, Johnnie Moten I, NP 12/21/2016 11:36 AM

## 2016-12-21 NOTE — MAU Note (Signed)
Having an "excessive amt of cervical mucous, blood noted in it".  Started the day after she was here.  Just completed the treatment for yeast infection. Has some pain, feels it is from gas.

## 2016-12-21 NOTE — Discharge Instructions (Signed)
Vaginal Bleeding During Pregnancy, First Trimester °A small amount of bleeding (spotting) from the vagina is common in early pregnancy. Sometimes the bleeding is normal and is not a problem, and sometimes it is a sign of something serious. Be sure to tell your doctor about any bleeding from your vagina right away. °Follow these instructions at home: °· Watch your condition for any changes. °· Follow your doctor's instructions about how active you can be. °· If you are on bed rest: °? You may need to stay in bed and only get up to use the bathroom. °? You may be allowed to do some activities. °? If you need help, make plans for someone to help you. °· Write down: °? The number of pads you use each day. °? How often you change pads. °? How soaked (saturated) your pads are. °· Do not use tampons. °· Do not douche. °· Do not have sex or orgasms until your doctor says it is okay. °· If you pass any tissue from your vagina, save the tissue so you can show it to your doctor. °· Only take medicines as told by your doctor. °· Do not take aspirin because it can make you bleed. °· Keep all follow-up visits as told by your doctor. °Contact a doctor if: °· You bleed from your vagina. °· You have cramps. °· You have labor pains. °· You have a fever that does not go away after you take medicine. °Get help right away if: °· You have very bad cramps in your back or belly (abdomen). °· You pass large clots or tissue from your vagina. °· You bleed more. °· You feel light-headed or weak. °· You pass out (faint). °· You have chills. °· You are leaking fluid or have a gush of fluid from your vagina. °· You pass out while pooping (having a bowel movement). °This information is not intended to replace advice given to you by your health care provider. Make sure you discuss any questions you have with your health care provider. °Document Released: 10/16/2013 Document Revised: 11/07/2015 Document Reviewed: 02/06/2013 °Elsevier Interactive  Patient Education © 2018 [employer] ° °

## 2016-12-22 LAB — GC/CHLAMYDIA PROBE AMP (~~LOC~~) NOT AT ARMC
Chlamydia: NEGATIVE
Neisseria Gonorrhea: NEGATIVE

## 2016-12-23 ENCOUNTER — Encounter: Payer: Self-pay | Admitting: General Practice

## 2016-12-23 ENCOUNTER — Ambulatory Visit: Payer: Self-pay | Admitting: *Deleted

## 2016-12-23 ENCOUNTER — Ambulatory Visit (HOSPITAL_COMMUNITY)
Admission: RE | Admit: 2016-12-23 | Discharge: 2016-12-23 | Disposition: A | Discharge status: Home / Self Care | Payer: Self-pay | Source: Ambulatory Visit | Attending: Obstetrics and Gynecology | Admitting: Obstetrics and Gynecology

## 2016-12-23 DIAGNOSIS — Z3A01 Less than 8 weeks gestation of pregnancy: Secondary | ICD-10-CM | POA: Insufficient documentation

## 2016-12-23 DIAGNOSIS — O209 Hemorrhage in early pregnancy, unspecified: Secondary | ICD-10-CM | POA: Insufficient documentation

## 2016-12-23 DIAGNOSIS — Z712 Person consulting for explanation of examination or test findings: Secondary | ICD-10-CM

## 2016-12-23 NOTE — Progress Notes (Signed)
Pt informed of US results today showing viable pregnancy with EDD 08/17/17.  She plans to receive prenatal care @ [facility] and will call to schedule appt.

## 2017-01-05 ENCOUNTER — Ambulatory Visit (HOSPITAL_COMMUNITY): Payer: Self-pay

## 2017-01-05 ENCOUNTER — Other Ambulatory Visit: Payer: Self-pay

## 2017-01-07 ENCOUNTER — Telehealth: Payer: Self-pay | Admitting: General Practice

## 2017-01-07 NOTE — Telephone Encounter (Signed)
Called patient to reschedule appointment.  Patient is scheduled for 02/03/17 at 9:20am. Patient voiced understanding.

## 2017-01-23 ENCOUNTER — Inpatient Hospital Stay (HOSPITAL_COMMUNITY)
Admission: AD | Admit: 2017-01-23 | Discharge: 2017-01-23 | Disposition: A | Discharge status: Home / Self Care | Payer: Medicaid Other | Source: Ambulatory Visit | Attending: Family Medicine | Admitting: Family Medicine

## 2017-01-23 ENCOUNTER — Encounter (HOSPITAL_COMMUNITY): Payer: Self-pay | Admitting: *Deleted

## 2017-01-23 DIAGNOSIS — O219 Vomiting of pregnancy, unspecified: Secondary | ICD-10-CM | POA: Insufficient documentation

## 2017-01-23 DIAGNOSIS — Z3A11 11 weeks gestation of pregnancy: Secondary | ICD-10-CM | POA: Insufficient documentation

## 2017-01-23 LAB — URINALYSIS, ROUTINE W REFLEX MICROSCOPIC
Bilirubin Urine: NEGATIVE
Glucose, UA: NEGATIVE mg/dL
KETONES UR: NEGATIVE mg/dL
Nitrite: NEGATIVE
PROTEIN: 30 mg/dL — AB
Specific Gravity, Urine: 1.021 (ref 1.005–1.030)
pH: 6 (ref 5.0–8.0)

## 2017-01-23 MED ORDER — ONDANSETRON 4 MG PO TBDP: 4.0000 mg | ORAL_TABLET | Freq: Three times a day (TID) | ORAL | 3 refills | Status: DC | PRN

## 2017-01-23 MED ORDER — ONDANSETRON 8 MG PO TBDP
8.0000 mg | ORAL_TABLET | Freq: Once | ORAL | Status: AC
  Administered 2017-01-23: 8 mg via ORAL
  Filled 2017-01-23: qty 1

## 2017-01-23 NOTE — Discharge Instructions (Signed)

## 2017-01-23 NOTE — MAU Provider Note (Signed)
History   G2P0010 @ 11.4 wks in with nausea and vomiting in pregnancy. States has tried everything over the counter and does not work. Also needs something that wont make her sleepy because she works. Has not thrown up since 0300 today but constantly feels nauseated.  CSN: [id number]  Arrival date & time 01/23/17  1414   First Provider Initiated Contact with Patient 01/23/17 1449      Chief Complaint  Patient presents with  . Emesis    HPI  Past Medical History:  Diagnosis Date  . Anemia   . Chlamydia 2013  . Gonorrhea 2013  . Infection    UTI  . Ovarian cyst     Past Surgical History:  Procedure Laterality Date  . ANTERIOR CRUCIATE LIGAMENT REPAIR Right   . WISDOM TOOTH EXTRACTION      Family History  Problem Relation Age of Onset  . Thyroid disease Mother   . Hypertension Other     Social History  Substance Use Topics  . Smoking status: Never Smoker  . Smokeless tobacco: Never Used  . Alcohol use 0.0 oz/week     Comment: not while preg    OB History    Gravida Para Term Preterm AB Living   2 1     0 0   SAB TAB Ectopic Multiple Live Births   0     0 0      Review of Systems  Constitutional: Negative.   HENT: Negative.   Eyes: Negative.   Respiratory: Negative.   Cardiovascular: Negative.   Gastrointestinal: Positive for nausea and vomiting.  Endocrine: Negative.   Genitourinary: Negative.   Musculoskeletal: Negative.   Skin: Negative.   Allergic/Immunologic: Negative.   Neurological: Negative.   Hematological: Negative.   Psychiatric/Behavioral: Negative.     Allergies  Patient has no known allergies.  Home Medications    BP 130/78 (BP Location: Right Arm)   Pulse 86   Temp 98.3 F (36.8 C) (Oral)   Resp 16   Wt 221 lb 4 oz (100.4 kg)   LMP 11/03/2016   SpO2 100%   BMI 37.98 kg/m   Physical Exam  Constitutional: She is oriented to person, place, and time. She appears well-developed.  HENT:  Head: Normocephalic.  Eyes: Pupils  are equal, round, and reactive to light.  Neck: Normal range of motion.  Cardiovascular: Normal rate, regular rhythm, normal heart sounds and intact distal pulses.   Pulmonary/Chest: Effort normal and breath sounds normal.  Abdominal: Soft. Bowel sounds are normal.  Musculoskeletal: Normal range of motion.  Neurological: She is alert and oriented to person, place, and time. She has normal reflexes.  Skin: Skin is warm and dry.  Psychiatric: She has a normal mood and affect. Her behavior is normal. Judgment and thought content normal.    MAU Course  Procedures (including critical care time)  Labs Reviewed  URINALYSIS, ROUTINE W REFLEX MICROSCOPIC   No results found.  Dx: nausea and vomiting of pregnancy   MDM  VSS, Exam WNL, taking po's well after zofran. Would like d/c home

## 2017-01-23 NOTE — MAU Note (Signed)
Having all day nausea and extreme vomiting.  It's to the point it makes her teeth and her throat hurt.  Is scared that since she isn't eating that she might be dehydrated.

## 2017-01-29 ENCOUNTER — Inpatient Hospital Stay (HOSPITAL_COMMUNITY)
Admission: AD | Admit: 2017-01-29 | Discharge: 2017-01-29 | Disposition: A | Discharge status: Home / Self Care | Payer: Medicaid Other | Source: Ambulatory Visit | Attending: Obstetrics and Gynecology | Admitting: Obstetrics and Gynecology

## 2017-01-29 DIAGNOSIS — Z3A11 11 weeks gestation of pregnancy: Secondary | ICD-10-CM | POA: Insufficient documentation

## 2017-01-29 DIAGNOSIS — O3431 Maternal care for cervical incompetence, first trimester: Secondary | ICD-10-CM | POA: Insufficient documentation

## 2017-01-29 DIAGNOSIS — O200 Threatened abortion: Secondary | ICD-10-CM | POA: Insufficient documentation

## 2017-01-29 DIAGNOSIS — O4690 Antepartum hemorrhage, unspecified, unspecified trimester: Secondary | ICD-10-CM

## 2017-01-29 LAB — URINALYSIS, ROUTINE W REFLEX MICROSCOPIC

## 2017-01-29 LAB — URINALYSIS, MICROSCOPIC (REFLEX)

## 2017-01-29 LAB — WET PREP, GENITAL
Clue Cells Wet Prep HPF POC: NONE SEEN
Sperm: NONE SEEN
Trich, Wet Prep: NONE SEEN
YEAST WET PREP: NONE SEEN

## 2017-01-29 NOTE — MAU Provider Note (Signed)
History     CSN: [id number]  Arrival date and time: 01/29/17 7829   First Provider Initiated Contact with Patient 01/29/17 1045      Chief Complaint  Patient presents with  . Vaginal Bleeding   HPI   Ms.Galilea Quito is a 26 y.o. female G2P0000 @ [redacted]w[redacted]d (by first trimester [place]) here in MAU with bright red vaginal bleeding. The bleeding started last night. This is the second episode of bleeding the patient has had this pregnancy. She has a history of incompetent cervix; with a delivery at 17 weeks. She is scheduled in the office next Wednesday. She denies pain at this time.   OB History    Gravida Para Term Preterm AB Living   2 1     0 0   SAB TAB Ectopic Multiple Live Births   0     0 0      Past Medical History:  Diagnosis Date  . Anemia   . Chlamydia 2013  . Gonorrhea 2013  . Infection    UTI  . Ovarian cyst     Past Surgical History:  Procedure Laterality Date  . ANTERIOR CRUCIATE LIGAMENT REPAIR Right   . WISDOM TOOTH EXTRACTION      Family History  Problem Relation Age of Onset  . Thyroid disease Mother   . Hypertension Other     Social History  Substance Use Topics  . Smoking status: Never Smoker  . Smokeless tobacco: Never Used  . Alcohol use 0.0 oz/week     Comment: not while preg    Allergies: No Known Allergies  Prescriptions Prior to Admission  Medication Sig Dispense Refill Last Dose  . anti-nausea (EMETROL) solution Take 10 mLs by mouth every 15 (fifteen) minutes as needed for nausea or vomiting.   01/22/2017 at Unknown time  . dimenhyDRINATE (DRAMAMINE) 50 MG tablet Take 50 mg by mouth every 8 (eight) hours as needed.   Past Week at Unknown time  . ondansetron (ZOFRAN ODT) 4 MG disintegrating tablet Take 1 tablet (4 mg total) by mouth every 8 (eight) hours as needed for nausea or vomiting. 20 tablet 3   . prenatal vitamin w/FE, FA (PRENATAL 1 + 1) 27-1 MG TABS tablet Take 1 tablet by mouth daily at 12 noon.   01/22/2017 at Unknown time  .  terconazole (TERAZOL 3) 0.8 % vaginal cream Place 1 applicator vaginally at bedtime. (Patient not taking: Reported on 12/21/2016) 20 g 0 Completed Course at Unknown time   Results for orders placed or performed during the hospital encounter of 01/29/17 (from the past 48 hour(s))  Urinalysis, Routine w reflex microscopic     Status: Abnormal   Collection Time: 01/29/17  9:40 AM  Result Value Ref Range   Color, Urine RED (A) YELLOW    Comment: BIOCHEMICALS MAY BE AFFECTED BY COLOR   APPearance TURBID (A) CLEAR   Specific Gravity, Urine  1.005 - 1.030    TEST NOT REPORTED DUE TO COLOR INTERFERENCE OF URINE PIGMENT   pH  5.0 - 8.0    TEST NOT REPORTED DUE TO COLOR INTERFERENCE OF URINE PIGMENT   Glucose, UA (A) NEGATIVE mg/dL    TEST NOT REPORTED DUE TO COLOR INTERFERENCE OF URINE PIGMENT   Hgb urine dipstick (A) NEGATIVE    TEST NOT REPORTED DUE TO COLOR INTERFERENCE OF URINE PIGMENT   Bilirubin Urine (A) NEGATIVE    TEST NOT REPORTED DUE TO COLOR INTERFERENCE OF URINE PIGMENT   Ketones, ur (  A) NEGATIVE mg/dL    TEST NOT REPORTED DUE TO COLOR INTERFERENCE OF URINE PIGMENT   Protein, ur (A) NEGATIVE mg/dL    TEST NOT REPORTED DUE TO COLOR INTERFERENCE OF URINE PIGMENT   Nitrite (A) NEGATIVE    TEST NOT REPORTED DUE TO COLOR INTERFERENCE OF URINE PIGMENT   Leukocytes, UA (A) NEGATIVE    TEST NOT REPORTED DUE TO COLOR INTERFERENCE OF URINE PIGMENT  Urinalysis, Microscopic (reflex)     Status: Abnormal   Collection Time: 01/29/17  9:40 AM  Result Value Ref Range   RBC / HPF TOO NUMEROUS TO COUNT 0 - 5 RBC/hpf   WBC, UA 6-30 0 - 5 WBC/hpf   Bacteria, UA RARE (A) NONE SEEN   Squamous Epithelial / LPF 6-30 (A) NONE SEEN   Urine-Other MUCOUS PRESENT   Wet prep, genital     Status: Abnormal   Collection Time: 01/29/17 10:35 AM  Result Value Ref Range   Yeast Wet Prep HPF POC NONE SEEN NONE SEEN   Trich, Wet Prep NONE SEEN NONE SEEN   Clue Cells Wet Prep HPF POC NONE SEEN NONE SEEN   WBC,  Wet Prep HPF POC MODERATE (A) NONE SEEN    Comment: MODERATE BACTERIA SEEN   Sperm NONE SEEN     Review of Systems  Constitutional: Negative for fever.  Gastrointestinal: Negative for abdominal pain.  Genitourinary: Positive for vaginal bleeding. Negative for dysuria.   Physical Exam   Blood pressure 131/64, pulse 79, temperature 99 F (37.2 C), temperature source Oral, resp. rate 17, height 5\' 4"  (1.626 m), weight 241 lb (109.3 kg), last menstrual period 11/03/2016, SpO2 100 %.  Physical Exam  Constitutional: She is oriented to person, place, and time. She appears well-developed and well-nourished. No distress.  HENT:  Head: Normocephalic.  Eyes: Pupils are equal, round, and reactive to light.  GI: Soft. She exhibits no distension. There is no tenderness. There is no rebound.  Genitourinary:  Genitourinary Comments: Wet prep and GC collected without speculum Cervix: closed, anterior, long,  small amount of dark red blood noted on exam glove.   Musculoskeletal: Normal range of motion.  Neurological: She is alert and oriented to person, place, and time.  Skin: Skin is warm. She is not diaphoretic.  Psychiatric: Her behavior is normal.    MAU Course  Procedures  None   MDM  Active fetus noted on bedside US; patient has had 2 first trimester, official US A positive blood type.   Assessment and Plan   A:  1. Vaginal bleeding in pregnancy   2. Threatened miscarriage     P:  Discharge home in stable condition Return to MAU if symptoms worsen Bleeding precautions Work note provided Go to your office visit next week, reviewed 17p with the patient Pelvic rest   Caliber Landess, Harolyn Rutherford, NP 01/29/2017 2:27 PM

## 2017-01-29 NOTE — MAU Note (Signed)
Vaginal bleeding started this morning or last night..the patient is not sure.  Pain 2/10, lower abdomen, dull pain.

## 2017-01-29 NOTE — Discharge Instructions (Signed)
Threatened Miscarriage °A threatened miscarriage is when you have vaginal bleeding during your first 20 weeks of pregnancy but the pregnancy has not ended. Your doctor will do tests to make sure you are still pregnant. The cause of the bleeding may not be known. This condition does not mean your pregnancy will end. It does increase the risk of it ending (complete miscarriage). °Follow these instructions at home: °· Make sure you keep all your doctor visits for prenatal care. °· Get plenty of rest. °· Do not have sex or use tampons if you have vaginal bleeding. °· Do not douche. °· Do not smoke or use drugs. °· Do not drink alcohol. °· Avoid caffeine. °Contact a doctor if: °· You have light bleeding from your vagina. °· You have belly pain or cramping. °· You have a fever. °Get help right away if: °· You have heavy bleeding from your vagina. °· You have clots of blood coming from your vagina. °· You have bad pain or cramps in your low back or belly. °· You have fever, chills, and bad belly pain. °This information is not intended to replace advice given to you by your health care provider. Make sure you discuss any questions you have with your health care provider. °Document Released: 05/14/2008 Document Revised: 11/07/2015 Document Reviewed: 03/28/2013 °Elsevier Interactive Patient Education © 2018 Elsevier Inc. °Vaginal Bleeding During Pregnancy, First Trimester °A small amount of bleeding (spotting) from the vagina is common in early pregnancy. Sometimes the bleeding is normal and is not a problem, and sometimes it is a sign of something serious. Be sure to tell your doctor about any bleeding from your vagina right away. °Follow these instructions at home: °· Watch your condition for any changes. °· Follow your doctor's instructions about how active you can be. °· If you are on bed rest: °? You may need to stay in bed and only get up to use the bathroom. °? You may be allowed to do some activities. °? If you need  help, make plans for someone to help you. °· Write down: °? The number of pads you use each day. °? How often you change pads. °? How soaked (saturated) your pads are. °· Do not use tampons. °· Do not douche. °· Do not have sex or orgasms until your doctor says it is okay. °· If you pass any tissue from your vagina, save the tissue so you can show it to your doctor. °· Only take medicines as told by your doctor. °· Do not take aspirin because it can make you bleed. °· Keep all follow-up visits as told by your doctor. °Contact a doctor if: °· You bleed from your vagina. °· You have cramps. °· You have labor pains. °· You have a fever that does not go away after you take medicine. °Get help right away if: °· You have very bad cramps in your back or belly (abdomen). °· You pass large clots or tissue from your vagina. °· You bleed more. °· You feel light-headed or weak. °· You pass out (faint). °· You have chills. °· You are leaking fluid or have a gush of fluid from your vagina. °· You pass out while pooping (having a bowel movement). °This information is not intended to replace advice given to you by your health care provider. Make sure you discuss any questions you have with your health care provider. °Document Released: 10/16/2013 Document Revised: 11/07/2015 Document Reviewed: 02/06/2013 °Elsevier Interactive Patient Education © 2018 Elsevier   Inc. ° °

## 2017-02-01 ENCOUNTER — Encounter: Payer: Medicaid Other | Admitting: Family Medicine

## 2017-02-01 LAB — GC/CHLAMYDIA PROBE AMP (~~LOC~~) NOT AT ARMC
CHLAMYDIA, DNA PROBE: NEGATIVE
Neisseria Gonorrhea: NEGATIVE

## 2017-02-03 ENCOUNTER — Ambulatory Visit (INDEPENDENT_AMBULATORY_CARE_PROVIDER_SITE_OTHER): Payer: Self-pay | Admitting: Advanced Practice Midwife

## 2017-02-03 ENCOUNTER — Other Ambulatory Visit (HOSPITAL_COMMUNITY)
Admission: RE | Admit: 2017-02-03 | Discharge: 2017-02-03 | Disposition: A | Discharge status: Home / Self Care | Payer: BLUE CROSS/BLUE SHIELD | Source: Ambulatory Visit | Attending: Family Medicine | Admitting: Family Medicine

## 2017-02-03 ENCOUNTER — Encounter: Payer: Self-pay | Admitting: Advanced Practice Midwife

## 2017-02-03 VITALS — BP 134/70 | HR 86 | Wt 221.1 lb

## 2017-02-03 DIAGNOSIS — Z124 Encounter for screening for malignant neoplasm of cervix: Secondary | ICD-10-CM

## 2017-02-03 DIAGNOSIS — O09291 Supervision of pregnancy with other poor reproductive or obstetric history, first trimester: Secondary | ICD-10-CM

## 2017-02-03 DIAGNOSIS — O09299 Supervision of pregnancy with other poor reproductive or obstetric history, unspecified trimester: Secondary | ICD-10-CM | POA: Insufficient documentation

## 2017-02-03 DIAGNOSIS — O99211 Obesity complicating pregnancy, first trimester: Secondary | ICD-10-CM

## 2017-02-03 DIAGNOSIS — O0991 Supervision of high risk pregnancy, unspecified, first trimester: Secondary | ICD-10-CM

## 2017-02-03 DIAGNOSIS — O209 Hemorrhage in early pregnancy, unspecified: Secondary | ICD-10-CM

## 2017-02-03 DIAGNOSIS — Z3481 Encounter for supervision of other normal pregnancy, first trimester: Secondary | ICD-10-CM

## 2017-02-03 DIAGNOSIS — Z01419 Encounter for gynecological examination (general) (routine) without abnormal findings: Secondary | ICD-10-CM | POA: Insufficient documentation

## 2017-02-03 DIAGNOSIS — O0990 Supervision of high risk pregnancy, unspecified, unspecified trimester: Secondary | ICD-10-CM | POA: Insufficient documentation

## 2017-02-03 DIAGNOSIS — O99210 Obesity complicating pregnancy, unspecified trimester: Secondary | ICD-10-CM

## 2017-02-03 NOTE — Progress Notes (Addendum)
   PRENATAL VISIT NOTE  Subjective:  Bailey Hicks is a 26 y.o. G2P0000 at [redacted]w[redacted]d being seen today for initial prenatal visit.  She is currently monitored for the following issues for this high-risk pregnancy and has GERD without esophagitis; Hx of incompetent cervix, currently pregnant; and Supervision of high risk pregnancy, antepartum on her problem list.  Patient reports vaginal bleeding 3 days ago with light brown spotting today.   . Vag. Bleeding: None, Scant.   . Denies leaking of fluid.   The following portions of the patient's history were reviewed and updated as appropriate: allergies, current medications, past family history, past medical history, past social history, past surgical history and problem list. Problem list updated.  Objective:   Vitals:   02/03/17 0928  BP: 134/70  Pulse: 86  Weight: 221 lb 1.6 oz (100.3 kg)    Fetal Status: Fetal Heart Rate (bpm): 158         VS reviewed, nursing note reviewed,  Constitutional: well developed, well nourished, no distress HEENT: normocephalic HEART: normal rate, heart sounds, regular rhythm RESP: normal effort, lung sounds clear and equal bilaterally Breast Exam:  right breast normal without mass, skin or nipple changes or axillary nodes, left breast normal without mass, skin or nipple changes or axillary nodes Abdomen: soft Neuro: alert and oriented x 3 Skin: warm, dry Psych: affect normal Pelvic exam: Cervix pink, visually closed, without lesion, scant tan/light brown creamy discharge, no active bleeding noted, vaginal walls and external genitalia normal  Dilation: Closed Effacement (%): 0 Station: Ballotable  Scant light brown bleeding on speculum and glove following exam  Assessment and Plan:  Pregnancy: G2P0000 at [redacted]w[redacted]d  1. Screening for cervical cancer  - Cytology - PAP  2. Supervision of high risk pregnancy, antepartum, first trimester --Clarified pt history with previous pregnancy and pt did present with  incompetent cervix and/or preterm labor symptoms and delivered without augmentation.  She reports she had pain during her previous pregnancy for several weeks before the delivery and presented to the hospital with vaginal bleeding/discharge c/w her mucous plug and was found to be dilated.  Her water broke in the hospital and she delivered within 24 hours.  Pathology report in chart indicates acute [chorioamnionitis from [facility] sent after delivery. --MFM consult ordered to review options for management of this pregnancy. Discussed some options with pt today including cerclage and 17P.  Cervix is closed and long today on exam. - Glucose Tolerance, 1 Hour --Offered genetic screening and pt desires so First Screen ordered.   - [place] MFM Fetal Nuchal Translucency; Future  3. History of incompetent cervix, currently pregnant  - AMB referral to maternal fetal medicine  4. Vaginal bleeding in pregnancy, first trimester --Scant, no active bleeding today. Pt reports bleeding more 3 days ago, tapered off and only light brown spotting now.   --Pelvic rest --Cervix closed/long today.  MFM follow up for hx incompetent cervix ordered.  Preterm labor symptoms and general obstetric precautions including but not limited to vaginal bleeding, contractions, leaking of fluid and fetal movement were reviewed in detail with the patient. Please refer to After Visit Summary for other counseling recommendations.  No Follow-up on file.   Sharen Counter, CNM

## 2017-02-03 NOTE — Patient Instructions (Signed)

## 2017-02-03 NOTE — Progress Notes (Signed)
Declines Babyscripts Breastfeeding ed done

## 2017-02-04 LAB — GLUCOSE TOLERANCE, 1 HOUR: Glucose, 1Hr PP: 111 mg/dL (ref 65–199)

## 2017-02-05 ENCOUNTER — Encounter (HOSPITAL_COMMUNITY): Payer: Self-pay

## 2017-02-05 ENCOUNTER — Inpatient Hospital Stay (HOSPITAL_COMMUNITY)
Admission: AD | Admit: 2017-02-05 | Discharge: 2017-02-05 | Disposition: A | Discharge status: Home / Self Care | Payer: Self-pay | Source: Ambulatory Visit | Attending: Family Medicine | Admitting: Family Medicine

## 2017-02-05 ENCOUNTER — Inpatient Hospital Stay (HOSPITAL_COMMUNITY): Payer: Self-pay

## 2017-02-05 DIAGNOSIS — O200 Threatened abortion: Secondary | ICD-10-CM | POA: Insufficient documentation

## 2017-02-05 DIAGNOSIS — R109 Unspecified abdominal pain: Secondary | ICD-10-CM

## 2017-02-05 DIAGNOSIS — O26891 Other specified pregnancy related conditions, first trimester: Secondary | ICD-10-CM

## 2017-02-05 DIAGNOSIS — O209 Hemorrhage in early pregnancy, unspecified: Secondary | ICD-10-CM

## 2017-02-05 DIAGNOSIS — Z3A13 13 weeks gestation of pregnancy: Secondary | ICD-10-CM | POA: Insufficient documentation

## 2017-02-05 DIAGNOSIS — O09291 Supervision of pregnancy with other poor reproductive or obstetric history, first trimester: Secondary | ICD-10-CM | POA: Insufficient documentation

## 2017-02-05 DIAGNOSIS — O4690 Antepartum hemorrhage, unspecified, unspecified trimester: Secondary | ICD-10-CM

## 2017-02-05 DIAGNOSIS — O99210 Obesity complicating pregnancy, unspecified trimester: Secondary | ICD-10-CM | POA: Insufficient documentation

## 2017-02-05 LAB — URINALYSIS, ROUTINE W REFLEX MICROSCOPIC
Bilirubin Urine: NEGATIVE
GLUCOSE, UA: NEGATIVE mg/dL
KETONES UR: NEGATIVE mg/dL
Leukocytes, UA: NEGATIVE
Nitrite: NEGATIVE
PROTEIN: 30 mg/dL — AB
Specific Gravity, Urine: 1.024 (ref 1.005–1.030)
pH: 5 (ref 5.0–8.0)

## 2017-02-05 LAB — CBC
HEMATOCRIT: 36.1 % (ref 36.0–46.0)
HEMOGLOBIN: 12.3 g/dL (ref 12.0–15.0)
MCH: 29.3 pg (ref 26.0–34.0)
MCHC: 34.1 g/dL (ref 30.0–36.0)
MCV: 86 fL (ref 78.0–100.0)
Platelets: 240 10*3/uL (ref 150–400)
RBC: 4.2 MIL/uL (ref 3.87–5.11)
RDW: 13.6 % (ref 11.5–15.5)
WBC: 8.8 10*3/uL (ref 4.0–10.5)

## 2017-02-05 LAB — WET PREP, GENITAL
CLUE CELLS WET PREP: NONE SEEN
SPERM: NONE SEEN
Trich, Wet Prep: NONE SEEN
YEAST WET PREP: NONE SEEN

## 2017-02-05 LAB — CYTOLOGY - PAP: Diagnosis: NEGATIVE

## 2017-02-05 LAB — HCG, QUANTITATIVE, PREGNANCY: HCG, BETA CHAIN, QUANT, S: 122054 m[IU]/mL — AB (ref <5)

## 2017-02-05 NOTE — MAU Note (Signed)
Pt reports cramping that started yesterday morning and reports the cramping is occurring every 10 minutes and rating the pain 7/10. Pt reports bleeding that began last night described as dark brown in a small amount which she now reports that the bleeding is now dark red when she wipes on the tissue.

## 2017-02-05 NOTE — MAU Provider Note (Signed)
History  Chief Complaint:  Abdominal Pain  Bailey Hicks is a 26 y.o. G2P0010 female at [redacted]w[redacted]d presenting with cramping and some bleeding. She states the cramping started 12h ago. She reports it being 7/10, and is located in lower middle abdomen. She reports it comes every 5 minutes. She noticed small amounts of bleeding last night, and noticed dark red blood on toilet paper when she wiped. She noticed passing of a dime sized blood clot this morning. She has not taken anything for cramping. She denies fevers, chills, HA, blurry vision, n/v/, urinary sx, vaginal itching/irritation, malodorous discharge. History of previous miscarriage at 17weeks. She states the cramping feels similar now.   Prenatal care at [facility] with first prenatal visit 2 days ago.  Obstetrical History: OB History    Gravida Para Term Preterm AB Living   2 1     0 0   SAB TAB Ectopic Multiple Live Births   0     0 0      Past Medical History: Past Medical History:  Diagnosis Date  . Anemia   . Chlamydia 2013  . Gonorrhea 2013  . Infection    UTI  . Ovarian cyst     Past Surgical History: Past Surgical History:  Procedure Laterality Date  . ANTERIOR CRUCIATE LIGAMENT REPAIR Right   . WISDOM TOOTH EXTRACTION      Social History: Social History   Social History  . Marital status: Married    Spouse name: N/A  . Number of children: N/A  . Years of education: N/A   Social History Main Topics  . Smoking status: Never Smoker  . Smokeless tobacco: Never Used  . Alcohol use No     Comment: not while preg  . Drug use: No  . Sexual activity: Not Currently    Partners: Male    Birth control/ protection: None     Comment: last intercourse November 17, 2016   Other Topics Concern  . None   Social History Narrative  . None    Allergies: No Known Allergies  Prescriptions Prior to Admission  Medication Sig Dispense Refill Last Dose  . ondansetron (ZOFRAN ODT) 4 MG disintegrating tablet Take 1 tablet  (4 mg total) by mouth every 8 (eight) hours as needed for nausea or vomiting. 20 tablet 3 02/04/2017 at Unknown time  . Prenatal Vit-Fe Fumarate-FA (PRENATAL MULTIVITAMIN) TABS tablet Take 1 tablet by mouth daily at 12 noon.   02/04/2017 at Unknown time  . terconazole (TERAZOL 3) 0.8 % vaginal cream Place 1 applicator vaginally at bedtime. (Patient not taking: Reported on 12/21/2016) 20 g 0 Not Taking    Review of Systems  Pertinent pos/neg as indicated in HPI  Physical Exam  Blood pressure 134/71, pulse 85, temperature 98.4 F (36.9 C), temperature source Oral, resp. rate 20, height 5\' 4"  (1.626 m), weight 100.4 kg (221 lb 4 oz), last menstrual period 11/03/2016, SpO2 99 %. General appearance: alert, cooperative and no distress Lungs: normal work of breathing Abdomen: gravid, soft, non-tender, no rebound or guarding, no peritoneal signs, neg McBurney's Extremities: no edema  Pelvic exam:  VULVA: normal appearing vulva with no masses, tenderness or lesions,  VAGINA: normal appearing vagina with normal color and discharge, no lesions,  CERVIX: dark bloody discharge expressing from cervical os, cervical os minimally dilated at 0.5cm  exam chaperoned by Larita Fife, RN and Caryl Ada, DO  Doppler 159  Cultures/Specimens: G/C and Wet Mount  collected    MAU Course  MDM Vitals reviewed and stable  Speculum Exam CBC/bHCG/UA Transvaginal US Patient verbalized agreement and understanding with the assessment and plan.  Labs:  Results for orders placed or performed during the hospital encounter of 02/05/17 (from the past 24 hour(s))  Urinalysis, Routine w reflex microscopic     Status: Abnormal   Collection Time: 02/05/17 11:07 AM  Result Value Ref Range   Color, Urine YELLOW YELLOW   APPearance CLEAR CLEAR   Specific Gravity, Urine 1.024 1.005 - 1.030   pH 5.0 5.0 - 8.0   Glucose, UA NEGATIVE NEGATIVE mg/dL   Hgb urine dipstick LARGE (A) NEGATIVE   Bilirubin Urine NEGATIVE NEGATIVE    Ketones, ur NEGATIVE NEGATIVE mg/dL   Protein, ur 30 (A) NEGATIVE mg/dL   Nitrite NEGATIVE NEGATIVE   Leukocytes, UA NEGATIVE NEGATIVE   RBC / HPF 0-5 0 - 5 RBC/hpf   WBC, UA 0-5 0 - 5 WBC/hpf   Bacteria, UA RARE (A) NONE SEEN   Squamous Epithelial / LPF 0-5 (A) NONE SEEN   Mucus PRESENT   Wet prep, genital     Status: Abnormal   Collection Time: 02/05/17 12:19 PM  Result Value Ref Range   Yeast Wet Prep HPF POC NONE SEEN NONE SEEN   Trich, Wet Prep NONE SEEN NONE SEEN   Clue Cells Wet Prep HPF POC NONE SEEN NONE SEEN   WBC, Wet Prep HPF POC FEW (A) NONE SEEN   Sperm NONE SEEN   CBC     Status: None   Collection Time: 02/05/17 12:26 PM  Result Value Ref Range   WBC 8.8 4.0 - 10.5 K/uL   RBC 4.20 3.87 - 5.11 MIL/uL   Hemoglobin 12.3 12.0 - 15.0 g/dL   HCT 53.9 67.2 - 89.7 %   MCV 86.0 78.0 - 100.0 fL   MCH 29.3 26.0 - 34.0 pg   MCHC 34.1 30.0 - 36.0 g/dL   RDW 91.5 04.1 - 36.4 %   Platelets 240 150 - 400 K/uL  hCG, quantitative, pregnancy     Status: Abnormal   Collection Time: 02/05/17 12:26 PM  Result Value Ref Range   hCG, Beta Chain, Quant, S 122,054 (H) <5 mIU/mL    Imaging:  OB Transvaginal/ abdominal US: IMPRESSION: Single live intrauterine gestation of 13 weeks 1 day.  Assessment and Plan  A: Bailey Hicks is a 25yo G2P0010 at [redacted]w[redacted]d who presents with threatened SAB. Patient has significant history of 17wk SAB and previously diagnosed cervical incompetency. US revealed viable IUP at 13wk1d indicating patient is not in active SAB. Subchorionic hemorrhage not noted on ultrasound, but still possible etiology of bleeding. CBC/ wet prep wnl.    P: Threatened SAB: Patient appropriate and stable for discharge. Instructed to keep follow-up appointment at MFM on 8/30 for evaluation of high risk pregnancy. Given hx of short cervix and 2nd trimester SAB, consider evaluation for cerclage procedure. Also would be candidate for 17P. If bleeding/ pain persists, consider repeat hCG  at follow up visit. Discussed potential for future miscarriage and precautions. Patient advised to use tylenol for pain control. Strict return precautions given regarding new or worsening symptoms. Recommend pelvic rest.   Dannette Barbara, Medical Student 8/24/20182:52 PM  OB FELLOW MAU DISCHARGE ATTESTATION  I have seen and examined this patient. I agree with above documentation in medical student's note. We decided on plan of care together.  I have made edits as needed.   Caryl Ada, DO OB Fellow 4:24 PM

## 2017-02-05 NOTE — Discharge Instructions (Signed)
Threatened Miscarriage °A threatened miscarriage is when you have vaginal bleeding during your first 20 weeks of pregnancy but the pregnancy has not ended. Your doctor will do tests to make sure you are still pregnant. The cause of the bleeding may not be known. This condition does not mean your pregnancy will end. It does increase the risk of it ending (complete miscarriage). °Follow these instructions at home: °· Make sure you keep all your doctor visits for prenatal care. °· Get plenty of rest. °· Do not have sex or use tampons if you have vaginal bleeding. °· Do not douche. °· Do not smoke or use drugs. °· Do not drink alcohol. °· Avoid caffeine. °Contact a doctor if: °· You have light bleeding from your vagina. °· You have belly pain or cramping. °· You have a fever. °Get help right away if: °· You have heavy bleeding from your vagina. °· You have clots of blood coming from your vagina. °· You have bad pain or cramps in your low back or belly. °· You have fever, chills, and bad belly pain. °This information is not intended to replace advice given to you by your health care provider. Make sure you discuss any questions you have with your health care provider. °Document Released: 05/14/2008 Document Revised: 11/07/2015 Document Reviewed: 03/28/2013 °Elsevier Interactive Patient Education © 2018 Elsevier Inc. ° °

## 2017-02-08 LAB — GC/CHLAMYDIA PROBE AMP (~~LOC~~) NOT AT ARMC
CHLAMYDIA, DNA PROBE: NEGATIVE
NEISSERIA GONORRHEA: NEGATIVE

## 2017-02-11 ENCOUNTER — Ambulatory Visit (HOSPITAL_COMMUNITY)
Admission: RE | Admit: 2017-02-11 | Discharge: 2017-02-11 | Disposition: A | Discharge status: Home / Self Care | Payer: BLUE CROSS/BLUE SHIELD | Source: Ambulatory Visit | Attending: Advanced Practice Midwife | Admitting: Advanced Practice Midwife

## 2017-02-11 ENCOUNTER — Encounter (HOSPITAL_COMMUNITY): Payer: Self-pay

## 2017-02-11 ENCOUNTER — Other Ambulatory Visit (HOSPITAL_COMMUNITY): Payer: Self-pay

## 2017-02-11 ENCOUNTER — Other Ambulatory Visit: Payer: Self-pay | Admitting: Advanced Practice Midwife

## 2017-02-11 DIAGNOSIS — R102 Pelvic and perineal pain: Secondary | ICD-10-CM | POA: Diagnosis not present

## 2017-02-11 DIAGNOSIS — Z3A13 13 weeks gestation of pregnancy: Secondary | ICD-10-CM

## 2017-02-11 DIAGNOSIS — O09891 Supervision of other high risk pregnancies, first trimester: Secondary | ICD-10-CM

## 2017-02-11 DIAGNOSIS — Z8619 Personal history of other infectious and parasitic diseases: Secondary | ICD-10-CM | POA: Insufficient documentation

## 2017-02-11 DIAGNOSIS — O99210 Obesity complicating pregnancy, unspecified trimester: Secondary | ICD-10-CM

## 2017-02-11 DIAGNOSIS — O09291 Supervision of pregnancy with other poor reproductive or obstetric history, first trimester: Secondary | ICD-10-CM

## 2017-02-11 DIAGNOSIS — O26891 Other specified pregnancy related conditions, first trimester: Secondary | ICD-10-CM | POA: Insufficient documentation

## 2017-02-11 DIAGNOSIS — Z8759 Personal history of other complications of pregnancy, childbirth and the puerperium: Secondary | ICD-10-CM | POA: Insufficient documentation

## 2017-02-11 DIAGNOSIS — Z3682 Encounter for antenatal screening for nuchal translucency: Secondary | ICD-10-CM

## 2017-02-11 DIAGNOSIS — O0990 Supervision of high risk pregnancy, unspecified, unspecified trimester: Secondary | ICD-10-CM

## 2017-02-11 DIAGNOSIS — Z9889 Other specified postprocedural states: Secondary | ICD-10-CM | POA: Insufficient documentation

## 2017-02-11 DIAGNOSIS — Z87440 Personal history of urinary (tract) infections: Secondary | ICD-10-CM | POA: Diagnosis not present

## 2017-02-11 DIAGNOSIS — O09299 Supervision of pregnancy with other poor reproductive or obstetric history, unspecified trimester: Secondary | ICD-10-CM

## 2017-02-11 DIAGNOSIS — O0991 Supervision of high risk pregnancy, unspecified, first trimester: Secondary | ICD-10-CM

## 2017-02-11 DIAGNOSIS — O09211 Supervision of pregnancy with history of pre-term labor, first trimester: Secondary | ICD-10-CM

## 2017-02-11 NOTE — Progress Notes (Signed)
Maternal Fetal Medicine Consultation  Requesting Provider(s): [facility]  Primary OB: Leftwich-Kirby CNM Reason for consultation: History of 17 week loss  HPI: 26yo P0010 now 13+2 weeks, here for evaluation for treatment of a previous 17 week loss in 2015. She reported that she had almost constant cramping and pelvic pressure throughout the entire pregnancy, culminating in painful contractions. She presented to MAU and was noted to be dilated with prolapsing membranes, and subsequently ruptured and delivered. Pathology showed extensive chorioamnionitis and funisitis. She is reporting occasinal cramping this pregnancy, nothing like her previous loss OB History: OB History    Gravida Para Term Preterm AB Living   2 1     0 0   SAB TAB Ectopic Multiple Live Births   0     0 0      PMH:  Past Medical History:  Diagnosis Date  . Anemia   . Chlamydia 2013  . Gonorrhea 2013  . Infection    UTI  . Ovarian cyst     PSH:  Past Surgical History:  Procedure Laterality Date  . ANTERIOR CRUCIATE LIGAMENT REPAIR Right   . WISDOM TOOTH EXTRACTION     Meds: PNV and zofran Allergies: NKDA FH: See EPIC section Soc: See EPIC section  Review of Systems: no vaginal bleeding or cramping/contractions, no LOF, no nausea/vomiting. All other systems reviewed and are negative.  PE:  VS: See EPIC section GEN: well-appearing female ABD: gravid, NT  Please see separate document for fetal ultrasound report.  A/P: History of 17 week loss The patient's history and the hospital records seem to point away from incompetent cervix and more toward preterm contractions as the cause of her loss. It is of course impossible to be certain. I would strongly advise starting 17-hydroxyprogesterone injections immediately rather than waiting for 16-20 weeks. I do not believe cerclage is indicted at this time but very close early follow up is warrented. To that end I have made weekly appointments for her for the next 4  weeks. If there is no cervical change by that time we can liberalize our follow up regimen  Thank you for the opportunity to be a part of the [facility]. Please contact our office if we can be of further assistance.   I spent approximately 30 minutes with this patient with over 50% of time spent in face-to-face counseling.

## 2017-02-12 ENCOUNTER — Encounter (HOSPITAL_COMMUNITY): Payer: Self-pay | Admitting: *Deleted

## 2017-02-12 ENCOUNTER — Inpatient Hospital Stay (HOSPITAL_COMMUNITY)
Admission: AD | Admit: 2017-02-12 | Discharge: 2017-02-12 | Disposition: A | Discharge status: Home / Self Care | Payer: BLUE CROSS/BLUE SHIELD | Source: Ambulatory Visit | Attending: Obstetrics & Gynecology | Admitting: Obstetrics & Gynecology

## 2017-02-12 ENCOUNTER — Other Ambulatory Visit (HOSPITAL_COMMUNITY): Payer: Self-pay | Admitting: *Deleted

## 2017-02-12 DIAGNOSIS — O039 Complete or unspecified spontaneous abortion without complication: Secondary | ICD-10-CM | POA: Insufficient documentation

## 2017-02-12 DIAGNOSIS — Z3A13 13 weeks gestation of pregnancy: Secondary | ICD-10-CM

## 2017-02-12 DIAGNOSIS — O0990 Supervision of high risk pregnancy, unspecified, unspecified trimester: Secondary | ICD-10-CM

## 2017-02-12 DIAGNOSIS — O99210 Obesity complicating pregnancy, unspecified trimester: Secondary | ICD-10-CM

## 2017-02-12 DIAGNOSIS — O09299 Supervision of pregnancy with other poor reproductive or obstetric history, unspecified trimester: Secondary | ICD-10-CM

## 2017-02-12 DIAGNOSIS — O09292 Supervision of pregnancy with other poor reproductive or obstetric history, second trimester: Secondary | ICD-10-CM

## 2017-02-12 LAB — DIFFERENTIAL
Basophils Absolute: 0 10*3/uL (ref 0.0–0.1)
Basophils Relative: 0 %
Eosinophils Absolute: 0 10*3/uL (ref 0.0–0.7)
Eosinophils Relative: 0 %
LYMPHS ABS: 4.2 10*3/uL — AB (ref 0.7–4.0)
LYMPHS PCT: 26 %
Monocytes Absolute: 0.5 10*3/uL (ref 0.1–1.0)
Monocytes Relative: 3 %
NEUTROS ABS: 11.3 10*3/uL — AB (ref 1.7–7.7)
NEUTROS PCT: 71 %

## 2017-02-12 LAB — CBC
HCT: 36.1 % (ref 36.0–46.0)
HEMOGLOBIN: 12.3 g/dL (ref 12.0–15.0)
MCH: 28.9 pg (ref 26.0–34.0)
MCHC: 34.1 g/dL (ref 30.0–36.0)
MCV: 84.9 fL (ref 78.0–100.0)
PLATELETS: 254 10*3/uL (ref 150–400)
RBC: 4.25 MIL/uL (ref 3.87–5.11)
RDW: 13.3 % (ref 11.5–15.5)
WBC: 16 10*3/uL — AB (ref 4.0–10.5)

## 2017-02-12 MED ORDER — IBUPROFEN 800 MG PO TABS: 800.0000 mg | ORAL_TABLET | Freq: Three times a day (TID) | ORAL | 0 refills | Status: DC | PRN

## 2017-02-12 MED ORDER — HYDROMORPHONE HCL 1 MG/ML IJ SOLN
1.0000 mg | Freq: Once | INTRAMUSCULAR | Status: AC
  Administered 2017-02-12: 1 mg via INTRAVENOUS

## 2017-02-12 MED ORDER — LACTATED RINGERS IV BOLUS (SEPSIS)
1000.0000 mL | Freq: Once | INTRAVENOUS | Status: AC
  Administered 2017-02-12: 1000 mL via INTRAVENOUS

## 2017-02-12 MED ORDER — HYDROMORPHONE HCL 1 MG/ML IJ SOLN
1.0000 mg | Freq: Once | INTRAMUSCULAR | Status: AC
  Administered 2017-02-12: 1 mg via INTRAVENOUS
  Filled 2017-02-12: qty 1

## 2017-02-12 MED ORDER — MISOPROSTOL 200 MCG PO TABS
ORAL_TABLET | ORAL | Status: AC
  Administered 2017-02-12: 800 ug via BUCCAL
  Filled 2017-02-12: qty 4

## 2017-02-12 MED ORDER — MISOPROSTOL 200 MCG PO TABS
800.0000 ug | ORAL_TABLET | Freq: Once | ORAL | Status: AC
  Administered 2017-02-12: 800 ug via BUCCAL

## 2017-02-12 MED ORDER — HYDROMORPHONE HCL 1 MG/ML IJ SOLN
INTRAMUSCULAR | Status: AC
  Filled 2017-02-12: qty 1

## 2017-02-12 MED ORDER — ONDANSETRON HCL 4 MG/2ML IJ SOLN
4.0000 mg | Freq: Once | INTRAMUSCULAR | Status: AC
  Administered 2017-02-12: 4 mg via INTRAVENOUS
  Filled 2017-02-12: qty 2

## 2017-02-12 NOTE — Discharge Instructions (Signed)

## 2017-02-12 NOTE — MAU Note (Signed)
Spontaneous delivery of 13 week fetus. Small amount of bleeding noted Placenta delivered 10 min later. Bleeding small amount . V/SS. Pt pain is 4/10 now.

## 2017-02-12 NOTE — Progress Notes (Signed)
Fetus delivered spontaneously

## 2017-02-12 NOTE — MAU Provider Note (Signed)
History     CSN: [id number]  Arrival date and time: 02/12/17 2013  First Provider Initiated Contact with Patient 02/12/17 2048      Chief Complaint  Patient presents with  . Abdominal Pain   HPI Bailey Hicks is a 26 y.o. G2P0000 at 4376w3d who presents with abdominal pain. Symptoms began earlier today. Reports sharp pains & intermittent cramping that are coming every 30 seconds. Rates pain 10/10. Has not treated. Noted some red spotting this evening. Denies fever/chills, vaginal discharge, or n/v/d. History of 17 wk loss. Goes to [facility] for prenatal care. Had ultrasound with MFM yesterday; had normal NT & CL of 4 cm.   OB History    Gravida Para Term Preterm AB Living   2 1     0 0   SAB TAB Ectopic Multiple Live Births   0     0 0      Past Medical History:  Diagnosis Date  . Anemia   . Chlamydia 2013  . Gonorrhea 2013  . Infection    UTI  . Ovarian cyst     Past Surgical History:  Procedure Laterality Date  . ANTERIOR CRUCIATE LIGAMENT REPAIR Right   . WISDOM TOOTH EXTRACTION      Family History  Problem Relation Age of Onset  . Adopted: Yes  . Thyroid disease Mother   . Hypertension Other     Social History  Substance Use Topics  . Smoking status: Never Smoker  . Smokeless tobacco: Never Used  . Alcohol use No     Comment: not while preg    Allergies: No Known Allergies  Prescriptions Prior to Admission  Medication Sig Dispense Refill Last Dose  . acetaminophen (TYLENOL) 325 MG tablet Take 650 mg by mouth every 6 (six) hours as needed for moderate pain.   Past Week at Unknown time  . ondansetron (ZOFRAN ODT) 4 MG disintegrating tablet Take 1 tablet (4 mg total) by mouth every 8 (eight) hours as needed for nausea or vomiting. 20 tablet 3 02/11/2017 at Unknown time    Review of Systems  Constitutional: Negative for chills and fever.  Gastrointestinal: Positive for abdominal pain. Negative for constipation, diarrhea, nausea and vomiting.   Genitourinary: Positive for vaginal bleeding. Negative for dysuria and vaginal discharge.   Physical Exam   Blood pressure 140/63, pulse 83, temperature 98.1 F (36.7 C), resp. rate (!) 22, height 5\' 4"  (1.626 m), weight 216 lb (98 kg), last menstrual period 11/03/2016.  Physical Exam  Nursing note and vitals reviewed. Constitutional: She is oriented to person, place, and time. She appears well-developed and well-nourished. She appears distressed.  HENT:  Head: Normocephalic and atraumatic.  Eyes: Conjunctivae are normal. Right eye exhibits no discharge. Left eye exhibits no discharge. No scleral icterus.  Neck: Normal range of motion.  Respiratory: Effort normal. No respiratory distress.  Genitourinary: Cervix exhibits no friability. There is bleeding (minimal amount of blood; no active bleeding) in the vagina.  Genitourinary Comments: SVE 1cm/70%  Neurological: She is alert and oriented to person, place, and time.  Skin: Skin is warm and dry. She is not diaphoretic.  Psychiatric: She has a normal mood and affect. Her behavior is normal. Judgment and thought content normal.    MAU Course  Procedures Results for orders placed or performed during the hospital encounter of 02/12/17 (from the past 24 hour(s))  CBC     Status: Abnormal   Collection Time: 02/12/17  9:07 PM  Result Value Ref  Range   WBC 16.0 (H) 4.0 - 10.5 K/uL   RBC 4.25 3.87 - 5.11 MIL/uL   Hemoglobin 12.3 12.0 - 15.0 g/dL   HCT 74.2 59.5 - 63.8 %   MCV 84.9 78.0 - 100.0 fL   MCH 28.9 26.0 - 34.0 pg   MCHC 34.1 30.0 - 36.0 g/dL   RDW 75.6 43.3 - 29.5 %   Platelets 254 150 - 400 K/uL  Differential     Status: Abnormal   Collection Time: 02/12/17  9:07 PM  Result Value Ref Range   Neutrophils Relative % 71 %   Neutro Abs 11.3 (H) 1.7 - 7.7 K/uL   Lymphocytes Relative 26 %   Lymphs Abs 4.2 (H) 0.7 - 4.0 K/uL   Monocytes Relative 3 %   Monocytes Absolute 0.5 0.1 - 1.0 K/uL   Eosinophils Relative 0 %    Eosinophils Absolute 0.0 0.0 - 0.7 K/uL   Basophils Relative 0 %   Basophils Absolute 0.0 0.0 - 0.1 K/uL    MDM FHT 165 On exam, cervix dilated 1cm/70% & patient very uncomfortable. IV fluids, dilaudid 1 mg, & zofran 4 mg ordered. CBC, wet prep, GC/CT, & u/a pending.  Called into room after patient reports gush of blood & vaginal pressure. Fetal parts in vagina. Fetus delivered with delivery of intact placenta within a few minutes. Patient reports improvement in pain. Will monitor bleeding.  Care turned over to [facility] Judeth Horn, NP 02/12/2017 9:49 PM   Patient passed 2 clots about the size of an apple. Given of Cytotec buccally. Will continue to monitor bleeding.   2324: Patient is more comfortable at this time. Bleeding has decreased. No blood on pad at this time.   Assessment and Plan   1. Spontaneous abortion   2. Obesity affecting pregnancy, antepartum   3. Hx of incompetent cervix, currently pregnant   4. Supervision of high risk pregnancy, antepartum   5. [redacted] weeks gestation of pregnancy    DC home Comfort measures reviewed  Bleeding precautions RX: ibuprofen 800mg  PRN  Return to MAU as needed FU with OB as planned  Follow-up Information    [facility] up.   Specialty:  Obstetrics and Gynecology Contact information: [address] [place] [address] [phone]

## 2017-02-12 NOTE — MAU Note (Signed)
Cramping since this afternoon. Some vag bleeding. Hx 17wk loss

## 2017-02-18 ENCOUNTER — Ambulatory Visit (HOSPITAL_COMMUNITY): Payer: PRIVATE HEALTH INSURANCE

## 2017-02-18 ENCOUNTER — Encounter (HOSPITAL_COMMUNITY): Payer: Self-pay

## 2017-02-23 ENCOUNTER — Other Ambulatory Visit: Payer: Self-pay

## 2017-02-25 ENCOUNTER — Ambulatory Visit (HOSPITAL_COMMUNITY): Payer: PRIVATE HEALTH INSURANCE

## 2017-03-02 NOTE — Progress Notes (Signed)
   GYNECOLOGY OFFICE VISIT NOTE  History:  26 y.o. G2P0000 here today for follow-up after spontaneous abortion at 13 weeks on 8/31 in the [facility]. Patient denies any passage of tissue. Is currently having tan/white vaginal discharge. She denies any bleeding, pelvic pain, fever, or other concerns. She would like to start birth control.    Past Medical History:  Diagnosis Date  . Anemia   . Chlamydia 2013  . Gonorrhea 2013  . Infection    UTI  . Ovarian cyst     Past Surgical History:  Procedure Laterality Date  . ANTERIOR CRUCIATE LIGAMENT REPAIR Right   . WISDOM TOOTH EXTRACTION      The following portions of the patient's history were reviewed and updated as appropriate: allergies, current medications, past family history, past medical history, past social history, past surgical history and problem list.   Review of Systems:  Pertinent items noted in HPI and remainder of comprehensive ROS otherwise negative.   Objective:  Physical Exam BP 121/78   Pulse 69   Wt 101.6 kg (224 lb)   LMP 11/03/2016   Breastfeeding? No   BMI 38.45 kg/m  CONSTITUTIONAL: Well-developed, well-nourished female in no acute distress.  CARDIOVASCULAR: Normal heart rate noted RESPIRATORY: Effort and breath sounds normal, no problems with respiration noted ABDOMEN: Soft, no distention noted. Non-tender  PELVIC: Normal appearing external genitalia; normal appearing vaginal mucosa. Cervix with white spots.  Abnormal discharge noted.  Normal uterine size, no other palpable masses, no uterine or adnexal tenderness. MUSCULOSKELETAL: Normal range of motion. No edema noted.  Labs and Imaging None  Assessment & Plan:  1. Spontaneous abortion Patient doing well. Denies any passage of additional tissue or abdominal/pelvic pain. Did not have follow-up imaging or labs after miscarriage. Will obtain bhcg today.  - B-HCG Quant  2. Encounter for initial prescription of contraceptive pills Rx for OCPs given.    3. Vaginal discharge Wet prep collected. Appears to have yeast infection. Will await results before treating.    Please refer to After Visit Summary for other counseling recommendations.   Return if symptoms worsen or fail to improve.   Caryl Ada, DO [facility] [employer]

## 2017-03-03 ENCOUNTER — Ambulatory Visit (INDEPENDENT_AMBULATORY_CARE_PROVIDER_SITE_OTHER): Payer: PRIVATE HEALTH INSURANCE | Admitting: Obstetrics and Gynecology

## 2017-03-03 ENCOUNTER — Encounter: Payer: Self-pay | Admitting: Obstetrics and Gynecology

## 2017-03-03 VITALS — BP 121/78 | HR 69 | Wt 224.0 lb

## 2017-03-03 DIAGNOSIS — Z30011 Encounter for initial prescription of contraceptive pills: Secondary | ICD-10-CM

## 2017-03-03 DIAGNOSIS — O039 Complete or unspecified spontaneous abortion without complication: Secondary | ICD-10-CM | POA: Diagnosis not present

## 2017-03-03 DIAGNOSIS — N898 Other specified noninflammatory disorders of vagina: Secondary | ICD-10-CM | POA: Diagnosis not present

## 2017-03-03 DIAGNOSIS — Z113 Encounter for screening for infections with a predominantly sexual mode of transmission: Secondary | ICD-10-CM

## 2017-03-03 MED ORDER — NORGESTIMATE-ETH ESTRADIOL 0.25-35 MG-MCG PO TABS: 1.0000 | ORAL_TABLET | Freq: Every day | ORAL | 11 refills | Status: AC

## 2017-03-03 NOTE — Addendum Note (Signed)
Addended by: Clearnce Sorrel on: 03/03/2017 11:23 AM   Modules accepted: Orders

## 2017-03-03 NOTE — Progress Notes (Signed)
Patient reports no bleeding but she is passing tan colored tissue.

## 2017-03-03 NOTE — Addendum Note (Signed)
Addended by: Garret Reddish on: 03/03/2017 04:45 PM   Modules accepted: Orders

## 2017-03-03 NOTE — Patient Instructions (Signed)

## 2017-03-04 ENCOUNTER — Ambulatory Visit (HOSPITAL_COMMUNITY): Payer: PRIVATE HEALTH INSURANCE

## 2017-03-04 LAB — CERVICOVAGINAL ANCILLARY ONLY
BACTERIAL VAGINITIS: NEGATIVE
Candida vaginitis: POSITIVE — AB
Trichomonas: NEGATIVE

## 2017-03-04 LAB — BETA HCG QUANT (REF LAB): HCG QUANT: 26 m[IU]/mL

## 2017-03-08 ENCOUNTER — Other Ambulatory Visit: Payer: Self-pay | Admitting: Obstetrics and Gynecology

## 2017-03-08 MED ORDER — FLUCONAZOLE 150 MG PO TABS: 150.0000 mg | ORAL_TABLET | Freq: Every day | ORAL | 0 refills | Status: DC

## 2017-03-11 ENCOUNTER — Ambulatory Visit (HOSPITAL_COMMUNITY): Payer: PRIVATE HEALTH INSURANCE

## 2017-04-06 IMAGING — US US SOFT TISSUE HEAD/NECK
1 series · 14 of 25 positions shown · non-contrast
Comparison: None.

CLINICAL DATA: 24-year-old female with a history of thyroid nodule

EXAM:
THYROID ULTRASOUND
TECHNIQUE: Ultrasound examination of the thyroid gland and adjacent soft
tissues was performed.

[Series 1: us soft tissue head/neck · 0.06mm/px · 14 of 55 slices shown]
[im 1/55]
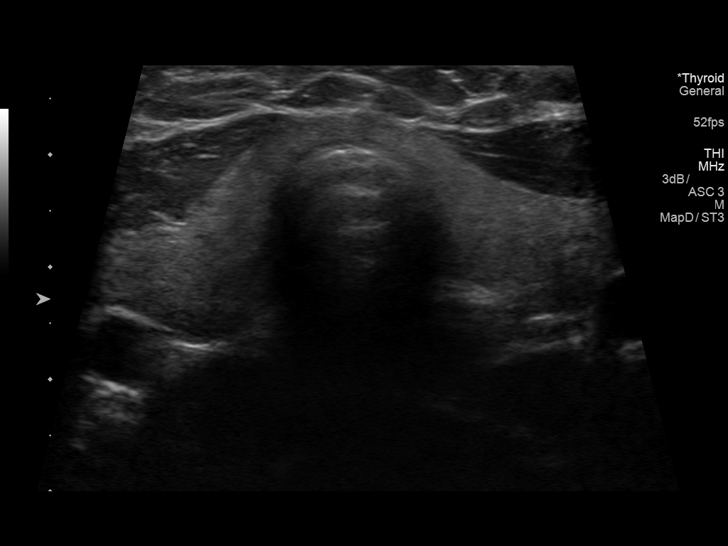
[im 5/55]
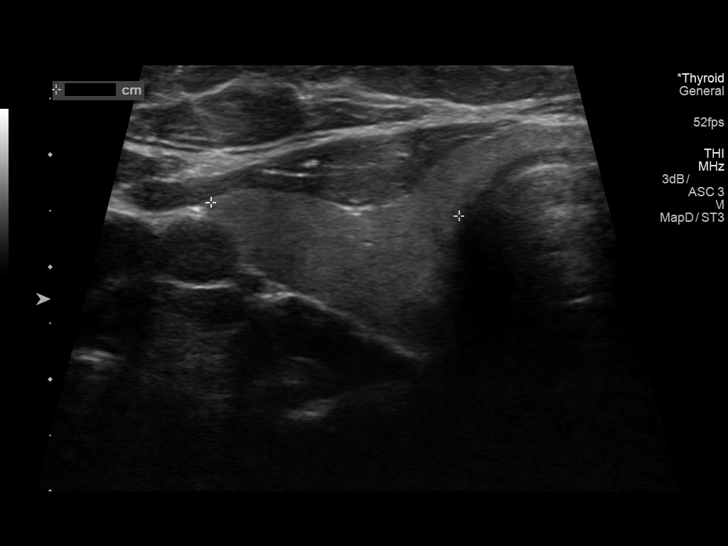
[im 10/55]
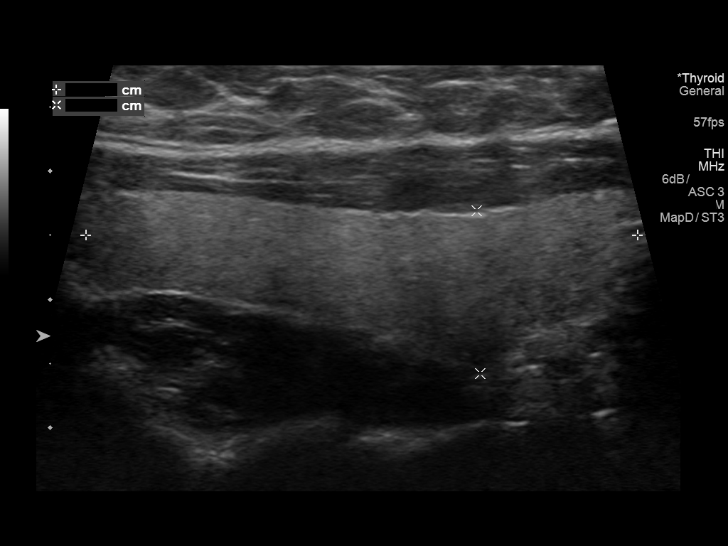
[im 14/55]
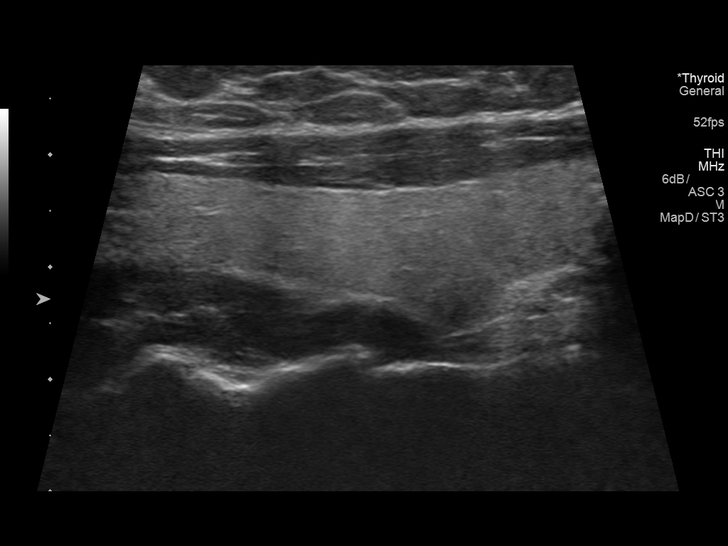
[im 19/55]
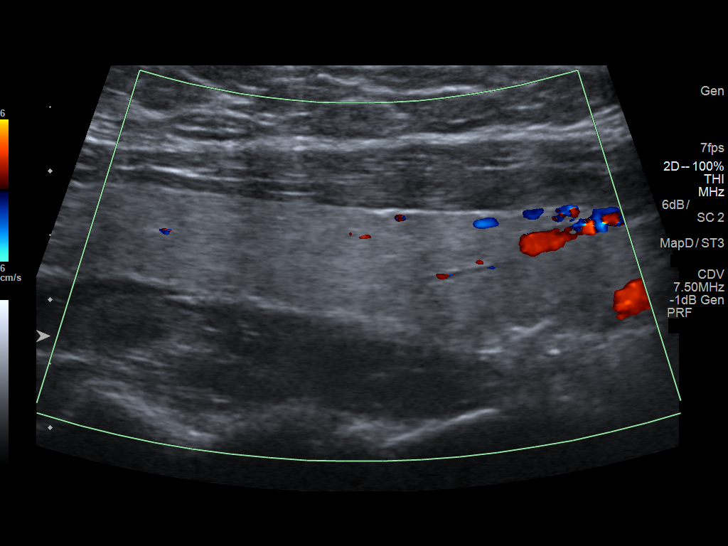
[im 21/55]
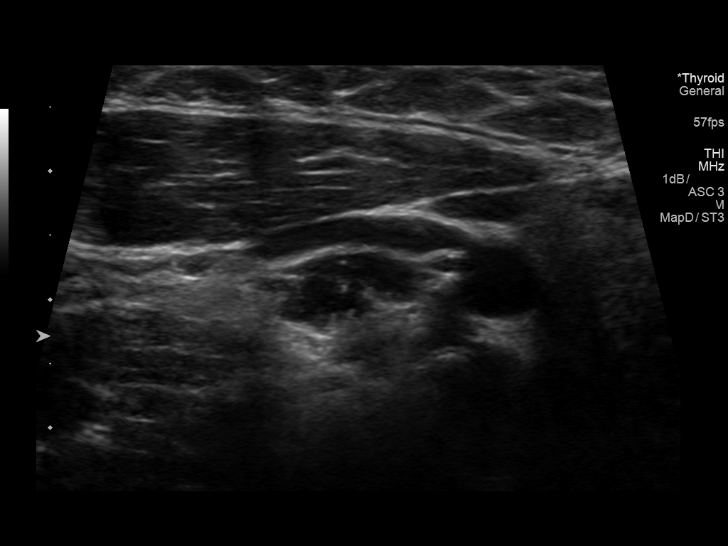
[im 25/55]
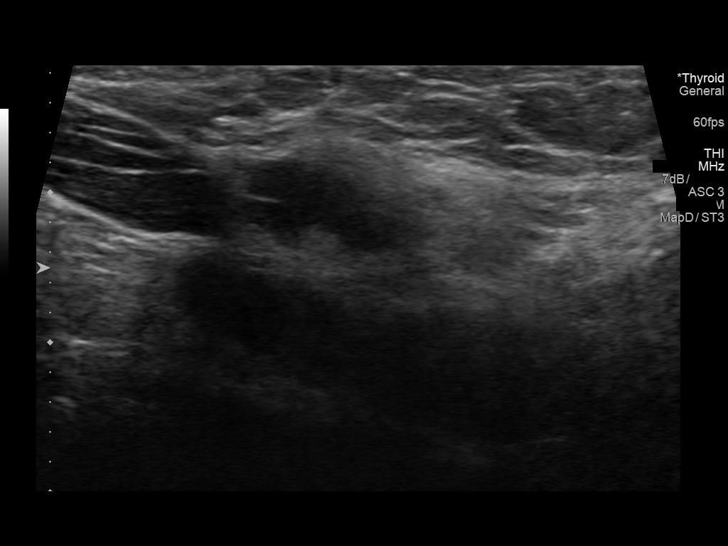
[im 30/55]
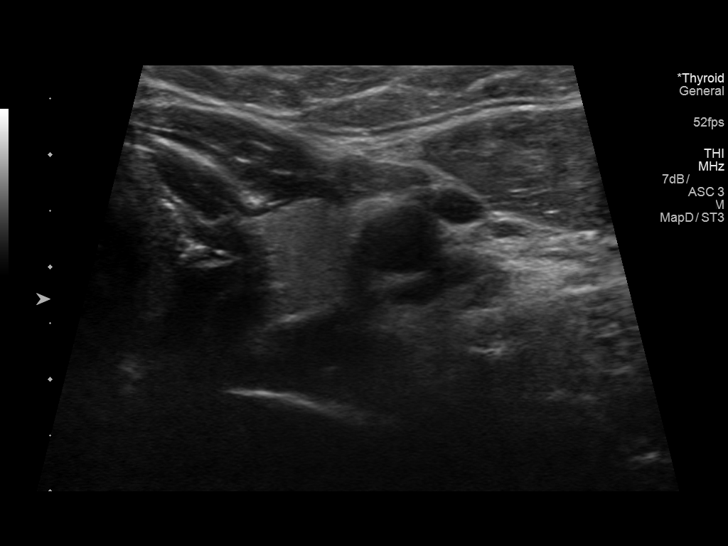
[im 34/55]
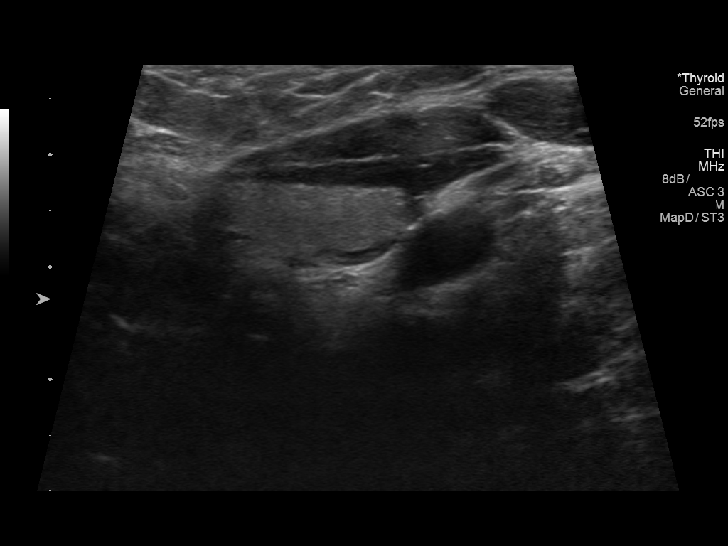
[im 37/55]
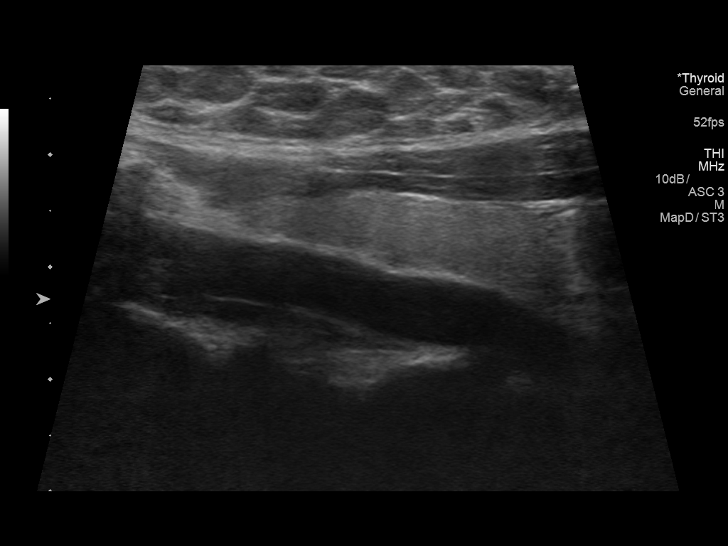
[im 41/55]
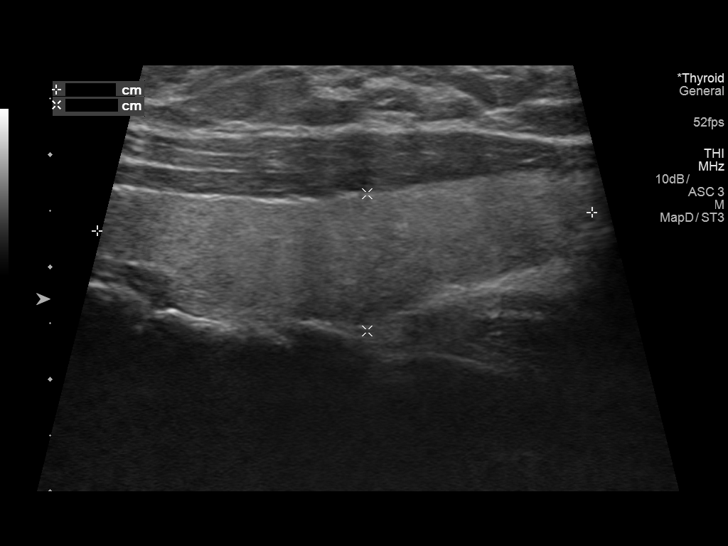
[im 46/55]
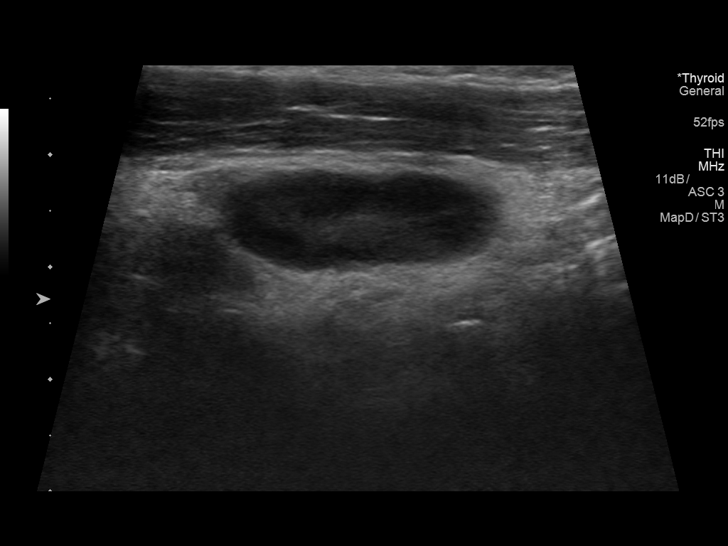
[im 50/55]
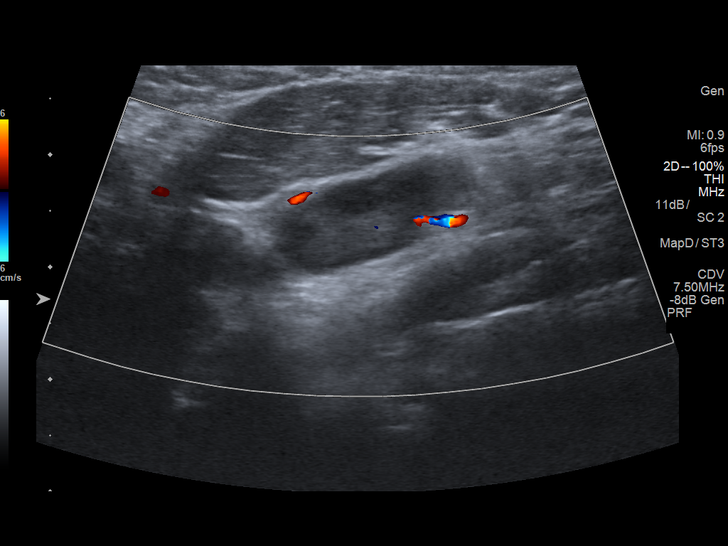
[im 55/55]
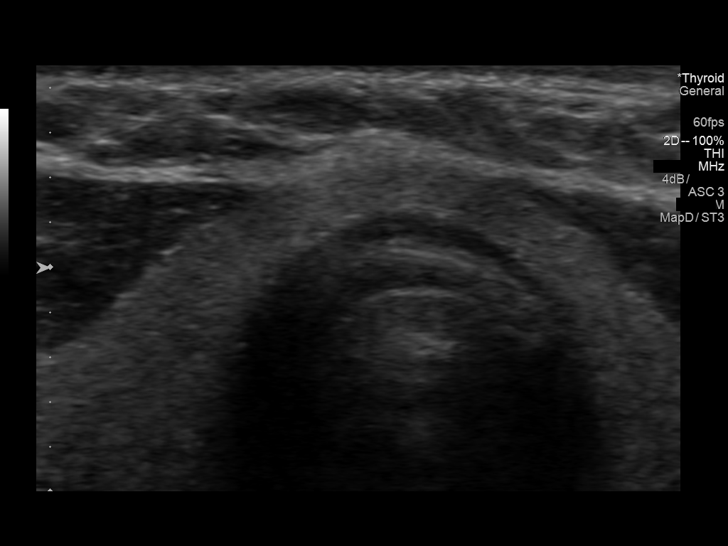

[14 of 25 positions shown; findings below may reference images not displayed]

FINDINGS: Right thyroid lobe

Measurements: 4.3 cm x 1.3 cm x 2.2 cm.  No nodules visualized.

Left thyroid lobe

Measurements: 4.4 cm x 1.3 cm x 2.1 cm.  No nodules visualized.

Isthmus

Thickness: 0.2 cm.  No nodules visualized.

Lymphadenopathy

None visualized.
IMPRESSION: Unremarkable sonographic survey of the thyroid.

Nonspecific lymph nodes of the head and neck.

## 2017-05-02 ENCOUNTER — Emergency Department (HOSPITAL_COMMUNITY)
Admission: EM | Admit: 2017-05-02 | Discharge: 2017-05-02 | Disposition: A | Discharge status: Home / Self Care | Payer: BLUE CROSS/BLUE SHIELD | Attending: Emergency Medicine | Admitting: Emergency Medicine

## 2017-05-02 ENCOUNTER — Emergency Department (HOSPITAL_COMMUNITY): Payer: BLUE CROSS/BLUE SHIELD

## 2017-05-02 ENCOUNTER — Other Ambulatory Visit: Payer: Self-pay

## 2017-05-02 ENCOUNTER — Encounter (HOSPITAL_COMMUNITY): Payer: Self-pay | Admitting: Student

## 2017-05-02 DIAGNOSIS — W109XXA Fall (on) (from) unspecified stairs and steps, initial encounter: Secondary | ICD-10-CM | POA: Diagnosis not present

## 2017-05-02 DIAGNOSIS — Z79899 Other long term (current) drug therapy: Secondary | ICD-10-CM | POA: Diagnosis not present

## 2017-05-02 DIAGNOSIS — Y939 Activity, unspecified: Secondary | ICD-10-CM | POA: Insufficient documentation

## 2017-05-02 DIAGNOSIS — Y929 Unspecified place or not applicable: Secondary | ICD-10-CM | POA: Diagnosis not present

## 2017-05-02 DIAGNOSIS — S93402A Sprain of unspecified ligament of left ankle, initial encounter: Secondary | ICD-10-CM | POA: Insufficient documentation

## 2017-05-02 DIAGNOSIS — Y999 Unspecified external cause status: Secondary | ICD-10-CM | POA: Insufficient documentation

## 2017-05-02 MED ORDER — ACETAMINOPHEN 325 MG PO TABS
650.0000 mg | ORAL_TABLET | Freq: Once | ORAL | Status: AC
  Administered 2017-05-02: 650 mg via ORAL
  Filled 2017-05-02: qty 2

## 2017-05-02 MED ORDER — IBUPROFEN 800 MG PO TABS
800.0000 mg | ORAL_TABLET | Freq: Once | ORAL | Status: AC
  Administered 2017-05-02: 800 mg via ORAL
  Filled 2017-05-02: qty 1

## 2017-05-02 NOTE — Discharge Instructions (Addendum)
You were seen in the emergency department today for an ankle injury. Your x-ray was negative for any fractures, breaks, or dislocations. We have applied an ankle brace, be sure to wear this especially when up and walking to prevent repeated injury. Keep the ankle elevated throughout the day and apply ice for 20 minutes every hour. Take Nonsteroidal anti-inflammatory medications and Tylenol as needed for pain.   Non steroidal anti inflammatory medications include Ibuprofen, Advil, and Alleve. These medications help both pain and swelling and are very useful in treating ankle pain.  They should be taken with food, as they can cause stomach upset, and more seriously, stomach bleeding, be sure to check the maximum dose on the medication bottle.Tylenol is generally safe, though you should not take more than 8 of the extra strength (500mg ) pills a day.  Return to the emergency department or [facility] if you have any new/concerning symptoms or worsening of your current symptoms.   The ankle exercises provided can be performed in 1 week in order to try to strengthen your ankle to prevent re-injury.

## 2017-05-02 NOTE — ED Provider Notes (Signed)
[facility]-EMERGENCY DEPT Provider Note   CSN: [id number] Arrival date & time: 05/02/17  1139     History   Chief Complaint No chief complaint on file.   HPI Bailey Hicks is a 26 y.o. female who presents with complaint of L ankle pain s/p injury last night at 0200.  Patient states she was ambulating down steps, missed a step, and everted the ankle.  Pain onset was immediate and has been constant since.  Patient states she needed to sit down following the injury, and has had difficulty ambulating secondary to pain.  She has been using a friend's crutch which she states is the incorrect size for her.  She has taken ibuprofen for pain without relief, most recent dose of 0400 this morning.  Denies any other injury, numbness, tingling, or weakness.  HPI  Past Medical History:  Diagnosis Date  . Anemia   . Chlamydia 2013  . Gonorrhea 2013  . Infection    UTI  . Ovarian cyst     Patient Active Problem List   Diagnosis Date Noted  . Obesity affecting pregnancy, antepartum 02/05/2017  . Hx of incompetent cervix, currently pregnant 02/03/2017  . GERD without esophagitis 03/15/2014    Past Surgical History:  Procedure Laterality Date  . ANTERIOR CRUCIATE LIGAMENT REPAIR Right   . WISDOM TOOTH EXTRACTION      OB History    Gravida Para Term Preterm AB Living   2 1     0 0   SAB TAB Ectopic Multiple Live Births   0     0 0       Home Medications    Prior to Admission medications   Medication Sig Start Date End Date Taking? Authorizing Provider  acetaminophen (TYLENOL) 325 MG tablet Take 650 mg by mouth every 6 (six) hours as needed for moderate pain.    [provider]  fluconazole (DIFLUCAN) 150 MG tablet Take 1 tablet (150 mg total) by mouth daily. 03/08/17   Pincus LargePhelps, Jazma Y, DO  ibuprofen (ADVIL,MOTRIN) 800 MG tablet Take 1 tablet (800 mg total) by mouth every 8 (eight) hours as needed. 02/12/17   Armando ReichertHogan, Heather D, CNM  naproxen sodium  (ANAPROX) 220 MG tablet Take 220 mg by mouth as needed.    [provider]  norgestimate-ethinyl estradiol (ORTHO-CYCLEN,SPRINTEC,PREVIFEM) 0.25-35 MG-MCG tablet Take 1 tablet by mouth daily. 03/03/17   Pincus LargePhelps, Jazma Y, DO  ondansetron (ZOFRAN ODT) 4 MG disintegrating tablet Take 1 tablet (4 mg total) by mouth every 8 (eight) hours as needed for nausea or vomiting. 01/23/17   Montez MoritaLawson, Marie D, CNM    Family History Family History  Adopted: Yes  Problem Relation Age of Onset  . Thyroid disease Mother   . Hypertension Other     Social History Social History   Tobacco Use  . Smoking status: Never Smoker  . Smokeless tobacco: Never Used  Substance Use Topics  . Alcohol use: No    Alcohol/week: 0.0 oz    Comment: not while preg  . Drug use: No     Allergies   Patient has no known allergies.   Review of Systems Review of Systems  Constitutional: Negative for chills and fever.  Musculoskeletal: Positive for arthralgias (L ankle).  Skin: Negative for rash.  Neurological: Negative for weakness and numbness.     Physical Exam Updated Vital Signs BP 122/86 (BP Location: Right Arm)   Pulse 71   Temp 97.9 F (36.6 C) (Oral)  Resp 18   Wt 102.1 kg (225 lb)   LMP 04/14/2017 (Approximate)   SpO2 100%   BMI 38.62 kg/m   Physical Exam  Constitutional: She appears well-developed and well-nourished. No distress.  HENT:  Head: Normocephalic and atraumatic.  Eyes: Conjunctivae are normal. Right eye exhibits no discharge. Left eye exhibits no discharge.  Cardiovascular:  Pulses:      Dorsalis pedis pulses are 2+ on the right side, and 2+ on the left side.       Posterior tibial pulses are 2+ on the right side, and 2+ on the left side.  Musculoskeletal:  Bilateral lower extremities: Full ROM at the knee.  Left lower extremity: mild amount of swelling to the L ankle laterally. No overlying erythema or ecchymosis. Ankle plantar/dorsiflexion is limited secondary to pain,  limiting assessment of strength. Tenderness to palpation over the lateral malleolus, PTFL, and CFL. Patient is otherwise non tender- no tenderness to the tibia or fibular head/shaft. Phalanges and metatarsals are nontender including base of the 5th. Navicular bone is nontender.   Neurological: She is alert.  Clear speech. Lower extremities: sensation intact to sharp/dull touch.   Skin: Skin is warm and dry. Capillary refill takes less than 2 seconds. No erythema.  Psychiatric: She has a normal mood and affect. Her behavior is normal. Thought content normal.  Nursing note and vitals reviewed.    ED Treatments / Results   Radiology Dg Ankle Complete Left  Result Date: 05/02/2017 CLINICAL DATA:  Tripped, fell 3 days ago.  Ankle pain. EXAM: LEFT ANKLE COMPLETE - 3+ VIEW COMPARISON:  None. FINDINGS: Diffuse soft tissue swelling, most pronounced laterally. No acute bony abnormality. Specifically, no fracture, subluxation, or dislocation. Soft tissues are intact. IMPRESSION: No acute bony abnormality. Electronically Signed   By: Charlett NoseKevin  Dover M.D.   On: 05/02/2017 13:26    Medications Ordered in ED Medications  ibuprofen (ADVIL,MOTRIN) tablet 800 mg (800 mg Oral Given 05/02/17 1254)  acetaminophen (TYLENOL) tablet 650 mg (650 mg Oral Given 05/02/17 1346)    Initial Impression / Assessment and Plan / ED Course  I have reviewed the triage vital signs and the nursing notes.  Pertinent labs & imaging results that were available during my care of the patient were reviewed by me and considered in my medical decision making (see chart for details).   Patient presents with left ankle pain status post injury last night.  Patient is neurovascularly intact distally.  X-ray negative therefore doubt fracture or dislocation.  History and physical exam consistent with left ankle sprain of the lateral ligaments.  Patient placed in a ankle ASO and provided crutches in the emergency department.  Will treat  supportively.  Provided return precautions.  Provided opportunity for questions, patient confirmed understanding   Final Clinical Impressions(s) / ED Diagnoses   Final diagnoses:  Sprain of left ankle, unspecified ligament, initial encounter    ED Discharge Orders    None       Cherly Andersonetrucelli, Trayce Maino R, PA-C 05/02/17 1424    Mancel BaleWentz, Elliott, MD 05/03/17 [phone]

## 2017-05-02 NOTE — ED Triage Notes (Signed)
Pt reports l/ankle pain. Pt stated that she missed a step and twisted l/ankle. Denies fall.

## 2017-08-05 IMAGING — CT CT MAXILLOFACIAL W/O CM
4 series · 16 of 47 positions shown, 18 images · non-contrast
Comparison: None.

CLINICAL DATA: 24-year-old female with paranasal sinus congestion.

EXAM:
CT HEAD WITHOUT CONTRAST
CT MAXILLOFACIAL WITHOUT CONTRAST
TECHNIQUE: Multidetector CT imaging of the head and maxillofacial structures
were performed using the standard protocol without intravenous
contrast. Multiplanar CT image reconstructions of the maxillofacial
structures were also generated.

[Series 3: head wo · axial · 0.45mm/px · z∈[-137,-31]mm · 7 of 30 slices shown, 9 images]
[im 4/30  brain]
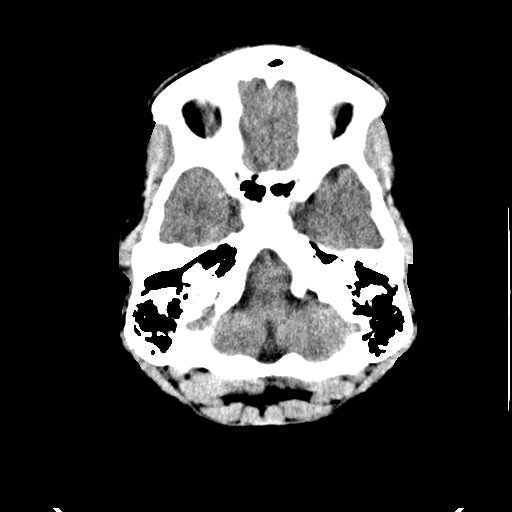
[im 4/30  bone]
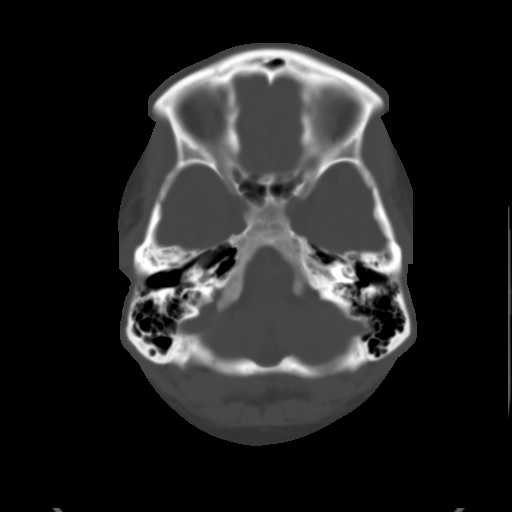
[im 8/30  bone]
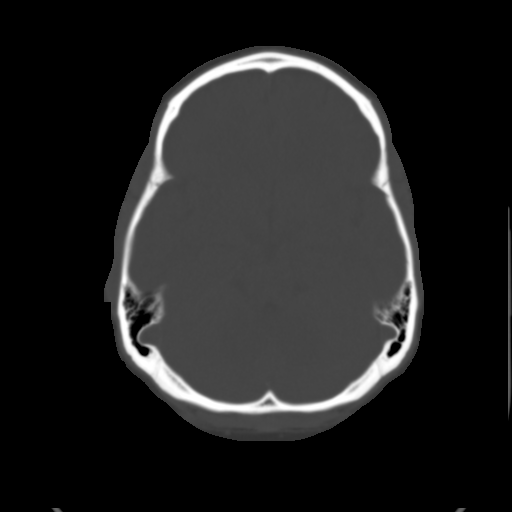
[im 11/30  bone]
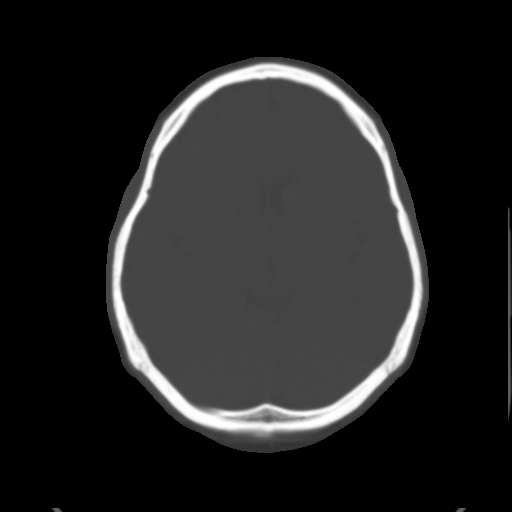
[im 15/30  bone]
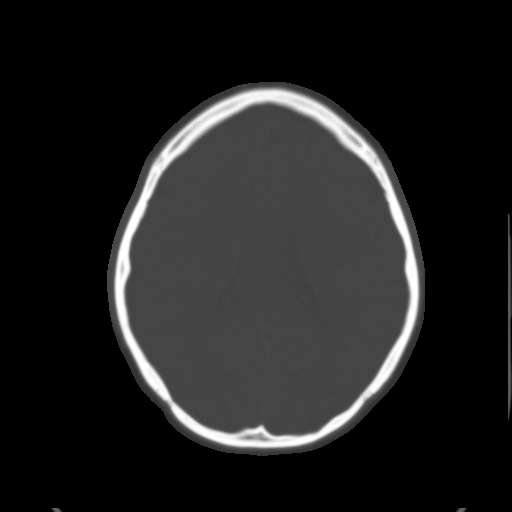
[im 19/30  brain]
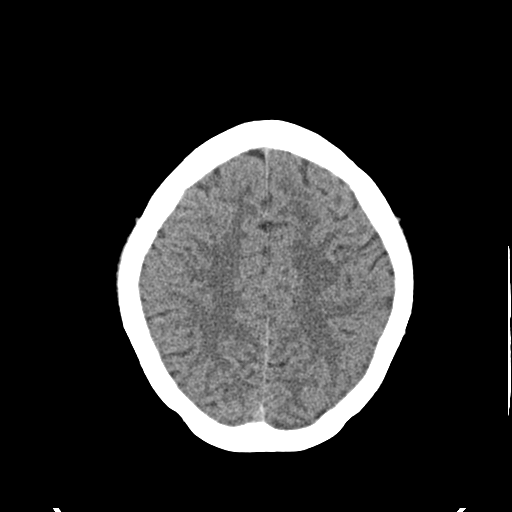
[im 19/30  bone]
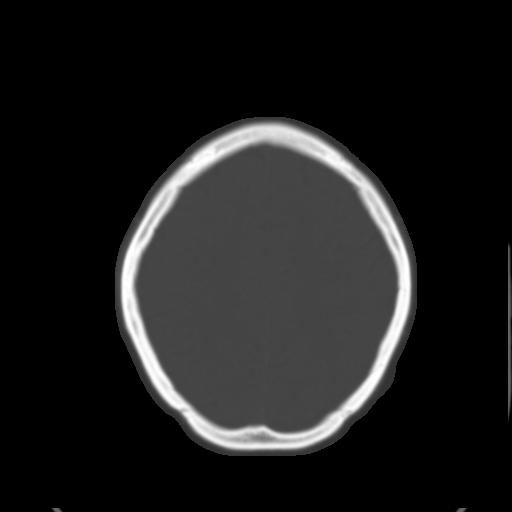
[im 22/30  bone]
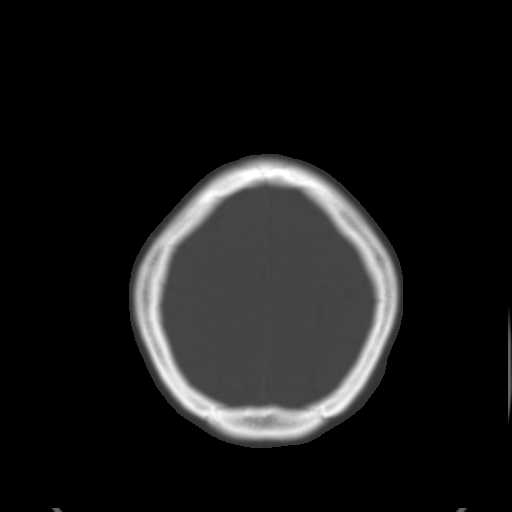
[im 26/30  bone]
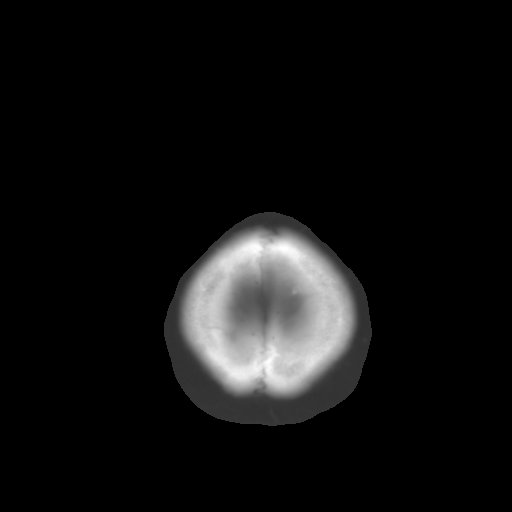

[Series 5: max soft · axial · 0.31mm/px · z∈[-252,-224]mm · 3 of 74 slices shown]
[im 8/74  brain]
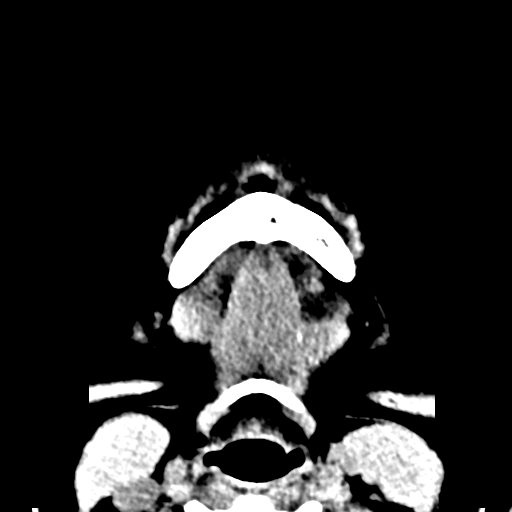
[im 15/74  brain]
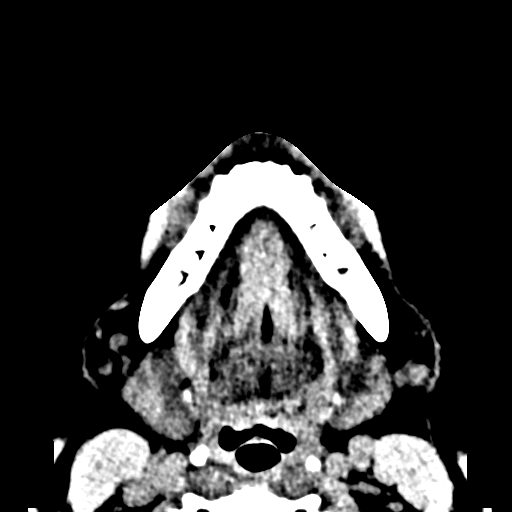
[im 22/74  brain]
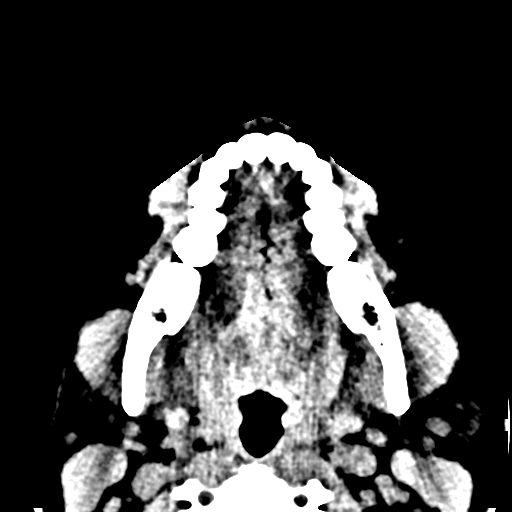

[Series 9: coronal soft · coronal · 0.30mm/px · 3 of 75 slices shown]
[im 25/75  bone]
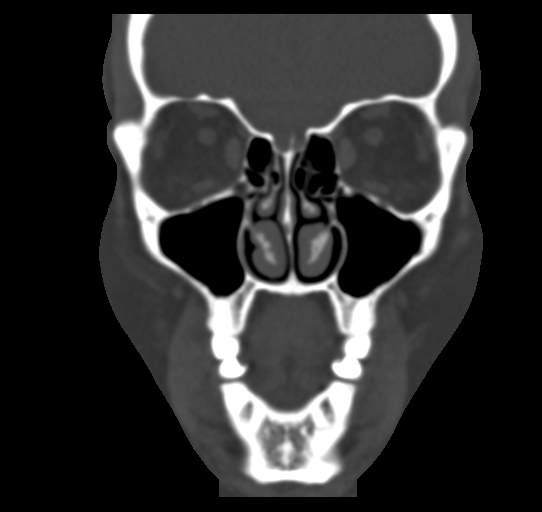
[im 33/75  bone]
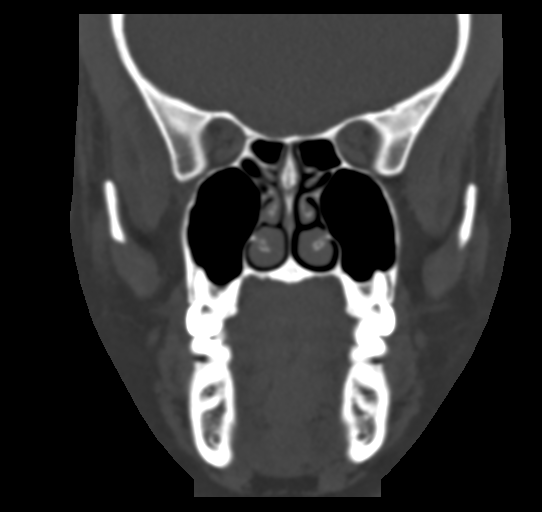
[im 42/75  bone]
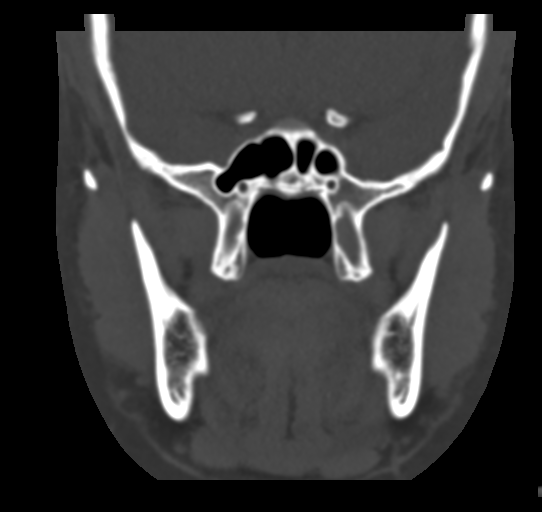

[Series 10: sagittal soft · sagittal · 0.29mm/px · 3 of 73 slices shown]
[im 25/73  bone]
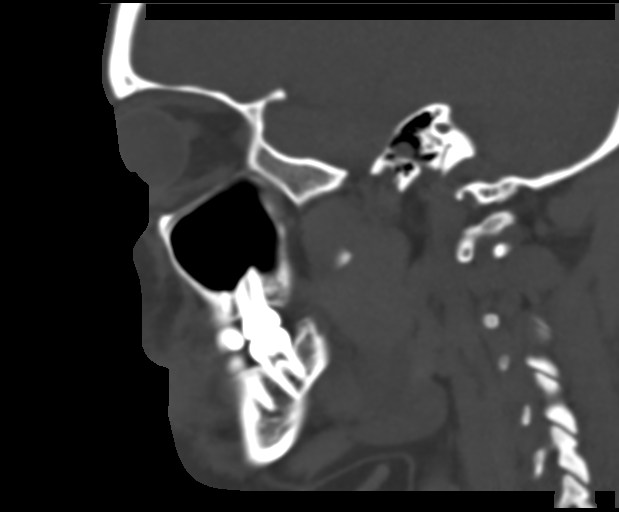
[im 37/73  bone]
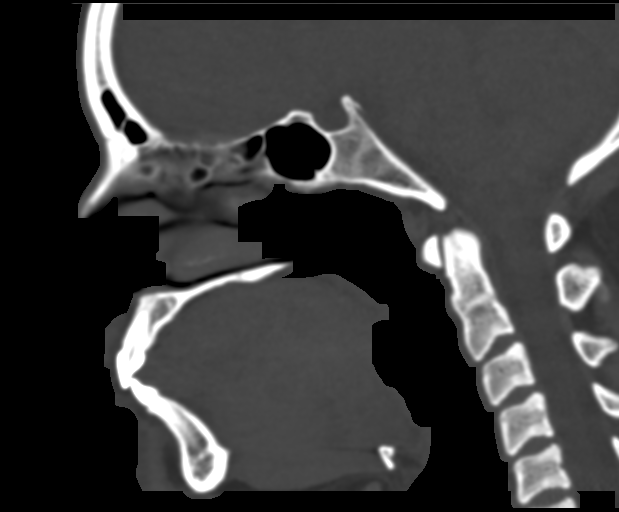
[im 49/73  bone]
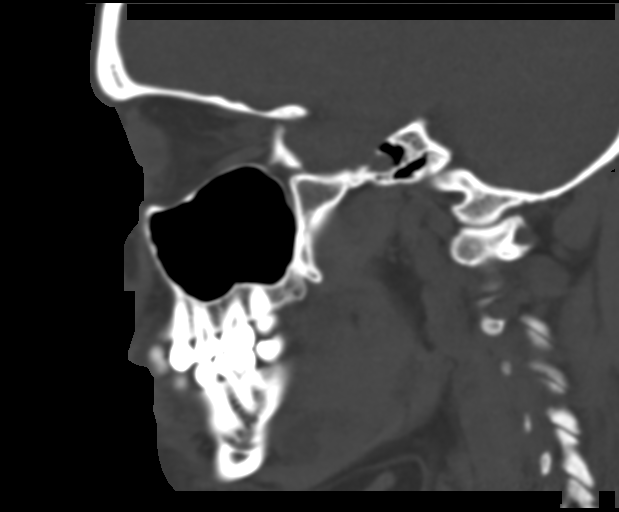

[16 of 47 positions shown; findings below may reference images not displayed]

FINDINGS: CT HEAD FINDINGS

The ventricles and the sulci are appropriate in size for the
patient's age. There is no intracranial hemorrhage. No midline shift
or mass effect identified. The gray-white matter differentiation is
preserved.

The visualized paranasal sinuses and mastoid air cells are well
aerated. The calvarium is intact.

CT MAXILLOFACIAL FINDINGS

There is no acute facial bone fractures. The maxilla, mandible, and
pterygoid plates are intact. The globes, orbits, retro-orbital fat
are preserved. The visualized paranasal sinuses are clear. The soft
tissues appear unremarkable
IMPRESSION: No acute intracranial pathology.

No acute facial bone fractures.

Clear paranasal sinuses.

## 2017-11-06 ENCOUNTER — Emergency Department (HOSPITAL_COMMUNITY)
Admission: EM | Admit: 2017-11-06 | Discharge: 2017-11-07 | Disposition: A | Discharge status: Home / Self Care | Payer: BLUE CROSS/BLUE SHIELD | Attending: Emergency Medicine | Admitting: Emergency Medicine

## 2017-11-06 ENCOUNTER — Encounter (HOSPITAL_COMMUNITY): Payer: Self-pay | Admitting: Emergency Medicine

## 2017-11-06 ENCOUNTER — Other Ambulatory Visit: Payer: Self-pay

## 2017-11-06 DIAGNOSIS — N939 Abnormal uterine and vaginal bleeding, unspecified: Secondary | ICD-10-CM | POA: Insufficient documentation

## 2017-11-06 DIAGNOSIS — Z79899 Other long term (current) drug therapy: Secondary | ICD-10-CM | POA: Diagnosis not present

## 2017-11-06 LAB — I-STAT BETA HCG BLOOD, ED (MC, WL, AP ONLY): I-stat hCG, quantitative: <5 m[IU]/mL (ref <5)

## 2017-11-06 NOTE — ED Triage Notes (Signed)
Pt reports having lower back pain and dark brown vaginal bleeding for the last 2 days. Denies any nausea or vomiting.

## 2017-11-06 NOTE — ED Notes (Signed)
PT HAS 2 GOLDS, 1 LIGHT GREEN, AND 1 LAVENDER IN THE MAIN LAB IF NEEDED

## 2017-11-07 LAB — WET PREP, GENITAL
SPERM: NONE SEEN
TRICH WET PREP: NONE SEEN
Yeast Wet Prep HPF POC: NONE SEEN

## 2017-11-07 NOTE — ED Provider Notes (Signed)
[facility]-EMERGENCY DEPT Provider Note   CSN: [id number] Arrival date & time: 11/06/17  2157     History   Chief Complaint Chief Complaint  Patient presents with  . Back Pain  . Vaginal Bleeding    HPI Bailey Hicks is a 27 y.o. female with history of anemia, STD is here for evaluation of vaginal bleeding.  Onset 2 days ago.  Vaginal bleeding described as dark red/brown.  Quantity approximately 1 tablespoon at that time, throughout the day.  Painless.  She has regular, monthly menstrual cycles last 5/15 lasting approximately 6 days.  She denies any clots or tissue, abdominal or pelvic pain, dysuria, hematuria, increased vaginal discharge, shortness of breath, chest pain, lightheadedness.  She is sexually active with men only and consistent condom use.  She is not concerned about STDs today.  HPI  Past Medical History:  Diagnosis Date  . Anemia   . Chlamydia 2013  . Gonorrhea 2013  . Infection    UTI  . Ovarian cyst     Patient Active Problem List   Diagnosis Date Noted  . Obesity affecting pregnancy, antepartum 02/05/2017  . Hx of incompetent cervix, currently pregnant 02/03/2017  . GERD without esophagitis 03/15/2014    Past Surgical History:  Procedure Laterality Date  . ANTERIOR CRUCIATE LIGAMENT REPAIR Right   . WISDOM TOOTH EXTRACTION       OB History    Gravida  2   Para  1   Term      Preterm      AB  0   Living  0     SAB  0   TAB      Ectopic      Multiple  0   Live Births  0            Home Medications    Prior to Admission medications   Medication Sig Start Date End Date Taking? Authorizing Provider  naproxen sodium (ANAPROX) 220 MG tablet Take 440 mg by mouth 2 (two) times daily as needed (pain).    Yes [provider]  norgestimate-ethinyl estradiol (ORTHO-CYCLEN,SPRINTEC,PREVIFEM) 0.25-35 MG-MCG tablet Take 1 tablet by mouth daily. Patient not taking: Reported on 11/07/2017 03/03/17   Pincus Large, DO    Family History Family History  Adopted: Yes  Problem Relation Age of Onset  . Thyroid disease Mother   . Hypertension Other     Social History Social History   Tobacco Use  . Smoking status: Never Smoker  . Smokeless tobacco: Never Used  Substance Use Topics  . Alcohol use: Yes    Alcohol/week: 0.0 oz    Comment: occ  . Drug use: No     Allergies   Patient has no known allergies.   Review of Systems Review of Systems  Genitourinary: Positive for vaginal bleeding.  All other systems reviewed and are negative.    Physical Exam Updated Vital Signs BP 129/87 (BP Location: Left Arm)   Pulse 78   Temp 98.3 F (36.8 C) (Oral)   Resp 16   Ht  (1.626 m)   Wt 108.9 kg (240 lb)   LMP 10/26/2017   SpO2 99%   BMI 41.20 kg/m   Physical Exam  Constitutional: She is oriented to person, place, and time. She appears well-developed and well-nourished. No distress.  Non toxic  HENT:  Head: Normocephalic and atraumatic.  Nose: Nose normal.  Moist mucous membranes   Eyes: Pupils are equal,  round, and reactive to light. Conjunctivae and EOM are normal.  Neck: Normal range of motion.  Cardiovascular: Normal rate, regular rhythm and intact distal pulses.  2+ DP and radial pulses bilaterally. No LE edema.   Pulmonary/Chest: Effort normal and breath sounds normal.  Abdominal: Soft. Bowel sounds are normal. There is no tenderness.  No G/R/R. No suprapubic or CVA tenderness. Negative Murphy's and McBurney's.   Genitourinary:  Genitourinary Comments: External genitalia normal without erythema, edema, tenderness, discharge or lesions.  No groin lymphadenopathy.  Vaginal mucosa pink without lesions.  Minimal dark yellow, brown tinged discharge around cervix. No CMT. Non palpable adnexa. Negative whiff test.  Musculoskeletal: Normal range of motion.  Neurological: She is alert and oriented to person, place, and time.  Skin: Skin is warm and dry. Capillary  refill takes less than 2 seconds.  Psychiatric: She has a normal mood and affect. Her behavior is normal.  Nursing note and vitals reviewed.    ED Treatments / Results  Labs (all labs ordered are listed, but only abnormal results are displayed) Labs Reviewed  WET PREP, GENITAL - Abnormal; Notable for the following components:      Result Value   Clue Cells Wet Prep HPF POC PRESENT (*)    WBC, Wet Prep HPF POC MODERATE (*)    All other components within normal limits  I-STAT BETA HCG BLOOD, ED ([facility], [facility], [facility] ONLY)  GC/CHLAMYDIA PROBE AMP ([place]) NOT AT [facility]    EKG None  Radiology No results found.  Procedures Procedures (including critical care time)  Medications Ordered in ED Medications - No data to display   Initial Impression / Assessment and Plan / ED Course  I have reviewed the triage vital signs and the nursing notes.  Pertinent labs & imaging results that were available during my care of the patient were reviewed by me and considered in my medical decision making (see chart for details).    27 year old female here with painless vaginal spotting x2 days.  Exam as above is reassuring without obvious source of bleeding on pelvic exam, CMT, adnexal fullness or tenderness.   hCG negative.  Hemodynamically stable.  Denies symptoms of anemia.  GC/Chlamydia and wet prep obtained.  Clue cells on wet prep however she denies symptoms of BV and negative whiff test, will defer tx at this time. She declined empiric STD treatment in the ER.    Final Clinical Impressions(s) / ED Diagnoses  Etiology unclear.  Will discharge with recommendations to watch and observe.  Recommended OB/GYN follow-up if persistent or recurrent vaginal spotting for further evaluation/ultrasound. Final diagnoses:  Vaginal spotting    ED Discharge Orders    None       Jerrell Mylar 11/07/17 3086    Dione Booze, MD 11/07/17 2252

## 2017-11-07 NOTE — Discharge Instructions (Addendum)
Exam today was reassuring, no obvious source of bleeding.   I suspect bleeding may be hormonal and uncomplicated, it should slow down soon and eventually stop.   You have been tested for sexually transmitted diseases as these can cause abnormal vaginal discharge and spotting.  You will be notified if test results are positive in 24-48 hours. If positive you will need treatment.   Some other causes of abnormal vaginal bleeding include cysts or uterine fibroids.  If bleeding does not slow down or reoccurs, follow up with OBGYN.   Return to ER for heavy vaginal bleeding or clots, light-headedness, shortness of breath, burning with urination, increased urination, vaginal discharge or odor.

## 2017-11-09 LAB — GC/CHLAMYDIA PROBE AMP (~~LOC~~) NOT AT ARMC
Chlamydia: NEGATIVE
Neisseria Gonorrhea: NEGATIVE

## 2018-01-06 ENCOUNTER — Encounter: Payer: Self-pay | Admitting: Obstetrics and Gynecology

## 2018-02-08 ENCOUNTER — Encounter: Payer: Self-pay | Admitting: Obstetrics & Gynecology

## 2018-02-08 ENCOUNTER — Ambulatory Visit: Payer: BLUE CROSS/BLUE SHIELD | Admitting: Obstetrics & Gynecology

## 2018-04-07 ENCOUNTER — Encounter: Payer: Self-pay | Admitting: *Deleted

## 2018-04-12 ENCOUNTER — Emergency Department (HOSPITAL_COMMUNITY): Payer: BLUE CROSS/BLUE SHIELD

## 2018-04-12 ENCOUNTER — Encounter (HOSPITAL_COMMUNITY): Payer: Self-pay | Admitting: Emergency Medicine

## 2018-04-12 ENCOUNTER — Emergency Department (HOSPITAL_COMMUNITY)
Admission: EM | Admit: 2018-04-12 | Discharge: 2018-04-12 | Disposition: A | Discharge status: Home / Self Care | Payer: BLUE CROSS/BLUE SHIELD | Attending: Emergency Medicine | Admitting: Emergency Medicine

## 2018-04-12 DIAGNOSIS — B9789 Other viral agents as the cause of diseases classified elsewhere: Secondary | ICD-10-CM | POA: Diagnosis not present

## 2018-04-12 DIAGNOSIS — J069 Acute upper respiratory infection, unspecified: Secondary | ICD-10-CM | POA: Insufficient documentation

## 2018-04-12 DIAGNOSIS — R509 Fever, unspecified: Secondary | ICD-10-CM | POA: Diagnosis present

## 2018-04-12 DIAGNOSIS — Z79899 Other long term (current) drug therapy: Secondary | ICD-10-CM | POA: Diagnosis not present

## 2018-04-12 LAB — GROUP A STREP BY PCR: GROUP A STREP BY PCR: NOT DETECTED

## 2018-04-12 LAB — POC URINE PREG, ED: PREG TEST UR: NEGATIVE

## 2018-04-12 NOTE — ED Triage Notes (Signed)
Patient here from home with complaints of sore throat, fever, and congestion x4 days.

## 2018-04-12 NOTE — ED Notes (Signed)
Pt attempting to void.  

## 2018-04-12 NOTE — ED Provider Notes (Signed)
[facility]-EMERGENCY DEPT Provider Note   CSN: [id number] Arrival date & time: 04/12/18  1529     History   Chief Complaint Chief Complaint  Patient presents with  . Sore Throat  . Nasal Congestion  . Fever    HPI Bailey Hicks is a 27 y.o. female presenting today for rhinorrhea and congestion that began 4 days ago followed by sore throat and cough that began 2 days ago.  Patient states that her cough is dry/nonproductive, denies hemoptysis.  Patient describes her sore throat as a mild burning sensation that is constant but worse when she swallows.  Patient states that she has been using ibuprofen for her symptoms with some relief.  Last used ibuprofen yesterday.  Patient states that she has had a "low-grade fever "in the 99 range.  Patient states that she has been eating and drinking well, last ate this morning without nausea/vomiting.  Patient denies diarrhea.  Patient denies measuring a fever over 100 F.  Patient denies chest pain or shortness of breath.  Patient states that her husband had been experiencing similar symptoms last week.  HPI  Past Medical History:  Diagnosis Date  . Anemia   . Chlamydia 2013  . Gonorrhea 2013  . Infection    UTI  . Ovarian cyst     Patient Active Problem List   Diagnosis Date Noted  . Obesity affecting pregnancy, antepartum 02/05/2017  . Hx of incompetent cervix, currently pregnant 02/03/2017  . GERD without esophagitis 03/15/2014    Past Surgical History:  Procedure Laterality Date  . ANTERIOR CRUCIATE LIGAMENT REPAIR Right   . WISDOM TOOTH EXTRACTION       OB History    Gravida  2   Para  1   Term      Preterm      AB  0   Living  0     SAB  0   TAB      Ectopic      Multiple  0   Live Births  0            Home Medications    Prior to Admission medications   Medication Sig Start Date End Date Taking? Authorizing Provider  naproxen sodium (ANAPROX) 220 MG tablet Take 440  mg by mouth 2 (two) times daily as needed (pain).     [provider]  norgestimate-ethinyl estradiol (ORTHO-CYCLEN,SPRINTEC,PREVIFEM) 0.25-35 MG-MCG tablet Take 1 tablet by mouth daily. Patient not taking: Reported on 11/07/2017 03/03/17   Pincus Large, DO    Family History Family History  Adopted: Yes  Problem Relation Age of Onset  . Thyroid disease Mother   . Hypertension Other     Social History Social History   Tobacco Use  . Smoking status: Never Smoker  . Smokeless tobacco: Never Used  Substance Use Topics  . Alcohol use: Yes    Alcohol/week: 0.0 standard drinks    Comment: occ  . Drug use: No     Allergies   Patient has no known allergies.   Review of Systems Review of Systems  Constitutional: Negative for appetite change, chills and fever.  HENT: Positive for congestion, rhinorrhea and sore throat. Negative for drooling, ear pain, facial swelling, trouble swallowing and voice change.   Respiratory: Positive for cough. Negative for chest tightness and shortness of breath.        Denies hemoptysis or sputum production.  Cardiovascular: Negative.  Negative for chest pain.  Gastrointestinal: Negative.  Negative for abdominal pain, blood in stool, diarrhea, nausea and vomiting.  Genitourinary: Negative.  Negative for dysuria and hematuria.  Musculoskeletal: Negative.  Negative for arthralgias, myalgias and neck pain.  Skin: Negative.  Negative for rash.  Neurological: Negative.  Negative for headaches.   Physical Exam Updated Vital Signs BP (!) 148/94 (BP Location: Left Arm)   Pulse 93   Temp 99.3 F (37.4 C) (Oral)   Resp 16   Ht 5\' 4"  (1.626 m)   Wt 108.9 kg   SpO2 99%   BMI 41.20 kg/m   Physical Exam  Constitutional: She appears well-developed and well-nourished. She does not appear ill. No distress.  HENT:  Head: Normocephalic and atraumatic.  Right Ear: Hearing, tympanic membrane, external ear and ear canal normal.  Left Ear: Hearing,  tympanic membrane, external ear and ear canal normal.  Nose: Rhinorrhea present.  Mouth/Throat: Uvula is midline, oropharynx is clear and moist and mucous membranes are normal. No uvula swelling. No oropharyngeal exudate, posterior oropharyngeal edema, posterior oropharyngeal erythema or tonsillar abscesses. Tonsils are 1+ on the right. Tonsils are 1+ on the left. No tonsillar exudate.  Eyes: Pupils are equal, round, and reactive to light. Conjunctivae and EOM are normal.  Neck: Trachea normal, normal range of motion, full passive range of motion without pain and phonation normal. Neck supple. No tracheal tenderness present. No tracheal deviation present.  Cardiovascular: Normal rate, regular rhythm, normal heart sounds and intact distal pulses.  Pulmonary/Chest: Effort normal and breath sounds normal. No accessory muscle usage. No respiratory distress. She has no decreased breath sounds. She exhibits no tenderness, no crepitus and no deformity.  Abdominal: Soft. There is no tenderness. There is no rigidity, no rebound and no guarding.  Lymphadenopathy:    She has no cervical adenopathy.  Neurological: She is alert. GCS eye subscore is 4. GCS verbal subscore is 5. GCS motor subscore is 6.  Speech is clear and goal oriented, follows commands Major Cranial nerves without deficit, no facial droop Moves extremities without ataxia, coordination intact Normal gait  Skin: Skin is warm and dry. Capillary refill takes less than 2 seconds.  Psychiatric: She has a normal mood and affect. Her behavior is normal.   ED Treatments / Results  Labs (all labs ordered are listed, but only abnormal results are displayed) Labs Reviewed  GROUP A STREP BY PCR  POC URINE PREG, ED    EKG None  Radiology Dg Chest 2 View  Result Date: 04/12/2018 CLINICAL DATA:  Sore throat, fever and congestion 4 days. EXAM: CHEST - 2 VIEW COMPARISON:  09/17/2016 FINDINGS: The heart size and mediastinal contours are within  normal limits. Both lungs are clear. The visualized skeletal structures are unremarkable. IMPRESSION: No active cardiopulmonary disease. Electronically Signed   By: Elberta Fortis M.D.   On: 04/12/2018 19:11    Procedures Procedures (including critical care time)  Medications Ordered in ED Medications - No data to display   Initial Impression / Assessment and Plan / ED Course  I have reviewed the triage vital signs and the nursing notes.  Pertinent labs & imaging results that were available during my care of the patient were reviewed by me and considered in my medical decision making (see chart for details).    Patient with symptoms consistent with URI for 4 days with nonproductive cough.  Patient's CXR is negative for acute infiltrate.  Strep test negative.  Symptoms are likely of viral etiology. Discussed that antibiotics are not indicated for viral  infections. Patient will be discharged with symptomatic treatment.  Encouraged fluids and rest.  Advised that she may take Tylenol as directed on the packaging for pain and fever.    BP (!) 148/94 (BP Location: Left Arm)   Pulse 93   Temp 99.3 F (37.4 C) (Oral)   Resp 16   Ht 5\' 4"  (1.626 m)   Wt 108.9 kg   SpO2 99%   BMI 41.20 kg/m   Patient does not meet SIRS criteria.  At this time there does not appear to be any evidence of an acute emergency medical condition and the patient appears stable for discharge with appropriate outpatient follow up. Diagnosis was discussed with patient who verbalizes understanding of care plan and is agreeable to discharge. I have discussed return precautions with patient who verbalizes understanding of return precautions. Patient strongly encouraged to follow-up with their PCP. All questions answered.   Note: Portions of this report may have been transcribed using voice recognition software. Every effort was made to ensure accuracy; however, inadvertent computerized transcription errors may still be  present.  Final Clinical Impressions(s) / ED Diagnoses   Final diagnoses:  Viral URI with cough    ED Discharge Orders    None       Elizabeth Palau 04/12/18 Dorene Sorrow, MD 04/12/18 [phone]

## 2018-04-12 NOTE — Discharge Instructions (Signed)
Please return to the Emergency Department for any new or worsening symptoms or if your symptoms do not improve. Please be sure to follow up with your Primary Care Physician as soon as possible regarding your visit today. If you do not have a Primary Doctor please use the resources below to establish one. Please rest and drink plenty of water to help with your symptoms.  You may use Tylenol as directed on the packaging to help with your symptoms.  Contact a health care provider if: Your symptoms last for 10 days or longer. Your symptoms get worse over time. You have a fever. You have severe sinus pain in your face or forehead. The glands in your jaw or neck become very swollen. Get help right away if: You feel pain or pressure in your chest. You have shortness of breath. You faint or feel like you will faint. You have severe and persistent vomiting. You feel confused or disoriented. Contact a health care provider if: You have new symptoms. You cough up pus. Your cough does not get better after 2-3 weeks, or your cough gets worse. You cannot control your cough with suppressant medicines and you are losing sleep. You develop pain that is getting worse or pain that is not controlled with pain medicines. You have a fever. You have unexplained weight loss. You have night sweats. Get help right away if: You cough up blood. You have difficulty breathing. Your heartbeat is very fast.  Do not take your medicine if  develop an itchy rash, swelling in your mouth or lips, or difficulty breathing.   RESOURCE GUIDE  Chronic Pain Problems: Contact [facility]  [phone] Patients need to be referred by their primary care doctor.  Insufficient Money for Medicine: Contact [employer]:  call "211" or [facility] [phone].  No Primary Care Doctor: Call [facility]  [phone] - can help you locate a primary care doctor that  accepts your insurance, provides certain  services, etc. Physician Referral Service- [phone]  Agencies that provide inexpensive medical care: [facility]  [phone] [facility]  [phone] [facility]  [phone] [facility]  [phone] [facility]  [phone] [facility]  [phone]  Medicaid-accepting [facility] Providers: [facility]- [address]  [phone], Mon-Fri 9am-7pm, Sat 9am-1pm [facility] [address] [place], [address] [place]  [phone] [facility]- [address], [address] [place]  [phone] [facility]- [address]  [phone] Renaye Rakers- [address] [place], [address], [phone]  Only accepts [place] patients after they have their name  applied to their card  Self Pay (no insurance) in [facility]: Sickle Cell Patients: Dr Willey Blade, [facility]  [address] [place], [phone] [facility]- [address] [place]  [phone]       [facility] [place]- [address]       -     [facility]- see information above (Speak to [employer] if you do not have insurance)       -  [facility]- [address] [place], [phone]       -  [facility]- [address],  [phone]       -  [facility]- [address], [phone]       -  Dr Vista Lawman-  [address], [place], [facility]- [address], [phone]       -  [facility]- [address], [place], also [address] [phone]       -    [facility]- [address], [place], 1st & 3rd Saturday   every month, 10am-1pm  1) Find a Doctor and Pay Out of Pocket Although you won't have to find out who is covered by your insurance plan, it is a good idea to ask around and get recommendations. You will then need to  call the office and see if the doctor you have chosen will accept you as a new patient and what types of options they offer for patients who are self-pay. Some doctors offer discounts or will set up payment plans for their patients who do not have insurance, but you will need to ask so you aren't surprised when you get to your appointment.  2) Contact Your Local Health Department Not all health departments have doctors that can see patients for sick visits, but many do, so it is worth a call to see if yours does. If you don't know where your local health department is, you can check in your phone book. The CDC also has a tool to help you locate your state's health department, and many state websites also have listings of all of their local health departments.  3) Find a [facility] If your illness is not likely to be very severe or complicated, you may want to try a walk in clinic. These are popping up all over the country in pharmacies, drugstores, and shopping centers. They're usually staffed by nurse practitioners or physician assistants that have been trained to treat common illnesses and complaints. They're usually fairly quick and inexpensive. However, if you have serious medical issues or chronic medical problems, these are probably not your best option  [facility] [place], [facility], [address] [place], phone [phone] or [phone].  Monday - Friday, call for an appointment. [place], [facility], [address], [place], phone [phone] or [phone].  Monday - Friday, call for an appointment.  Abuse/Neglect: [place] [phone] [place] [phone] (After Hours)  Emergency Shelter:  [facility] [phone]  Maternity Homes: [facility] [place] [phone] [place] [phone]  MRSA Hotline #:   (915) 620-2404  [facility]. [address]         [place] Phone:  [phone]  Phone:  [phone]                   Phone:  [place], [place]- [phone]       -     [facility] in [place], [address],                                  [place] [phone] or [phone] (After Hours)   [place]  Substance Abuse Resources: [facility] and Drug Services  [phone] [place] [phone] [facility] Ch[facility]a Pester [phone] Residential & Outpatient Substance Abuse Program  [phone]  Psychological Services: [place]  [phone] [place]  [place], [place]. [address] [place], [place]: [phone] or [phone], Pic[employer]uy  Dental Assistance  If unable to pay or uninsured, contact:  [facility] or [facility]. to become qualified for the adult dental clinic.  Patients with Medicaid: [facility] [phone] [address], [place] [address], [phone]  If unable to pay, or uninsured, contact [facility] [phone]) or [place] [phone] in [place], [place] in [facility]) to become qualified for the adult dental clinic   Other [place], [place], [place], [address], [place], [place], 2nd and 4th Thursday  of the month at 6:30am.  10 clients each day by appointment, can sometimes see walk-in patients if someone does not show for an appointment. [facility]- [address] Hillard Danker [place], [place], [address], [place], [place], [place], [address], [place] Department- [phone] [facility] [phone]

## 2018-11-17 IMAGING — US US OB TRANSVAGINAL
1 series · 15 of 28 positions shown · non-contrast
Comparison: December 17, 2016

CLINICAL DATA: Vaginal bleeding

EXAM:
TRANSVAGINAL OB ULTRASOUND
TECHNIQUE: Transvaginal ultrasound was performed for complete evaluation of the
gestation as well as the maternal uterus, adnexal regions, and
pelvic cul-de-sac.

[Series 1: us ob transvaginal · 28 acquisitions, 15 frames shown]
[im 1/28]
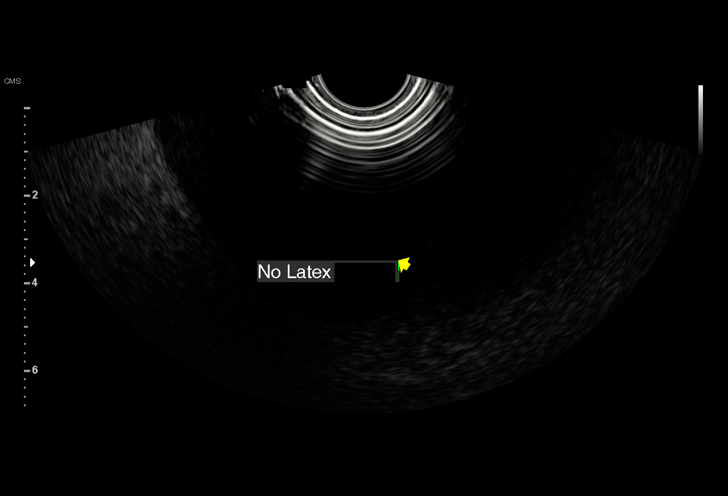
[im 3/28]
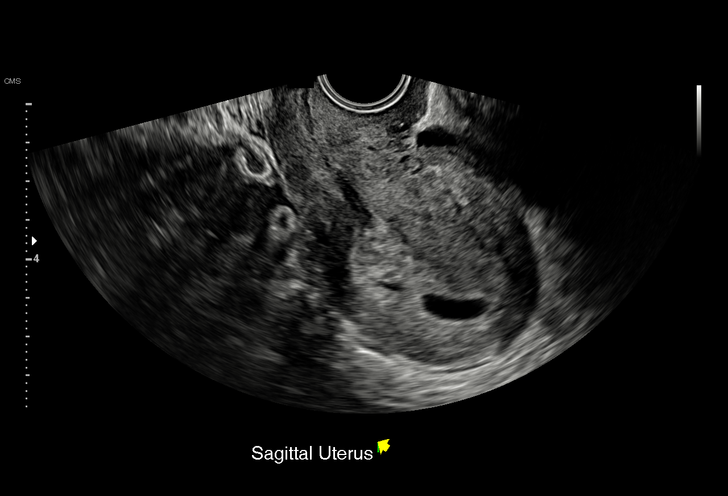
[im 5/28]
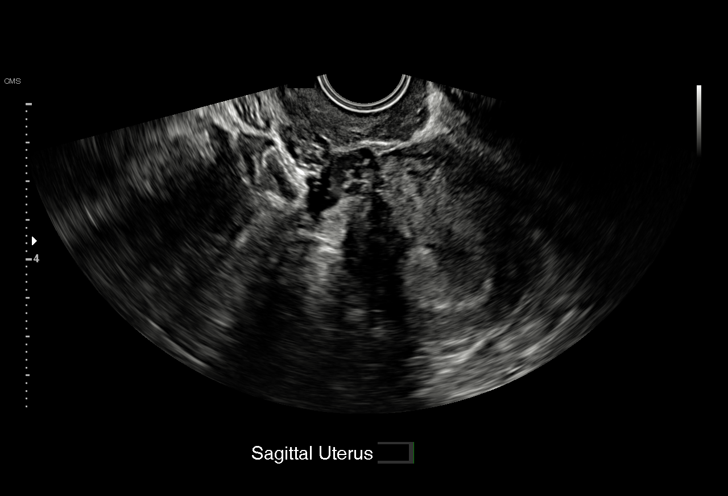
[im 7/28]
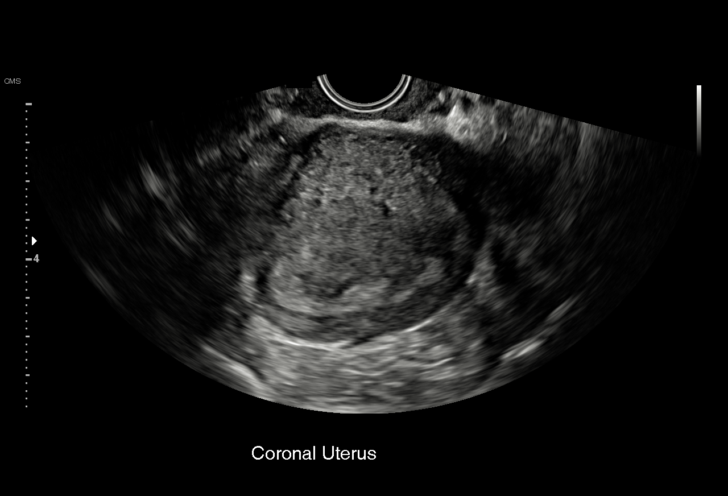
[im 9/28]
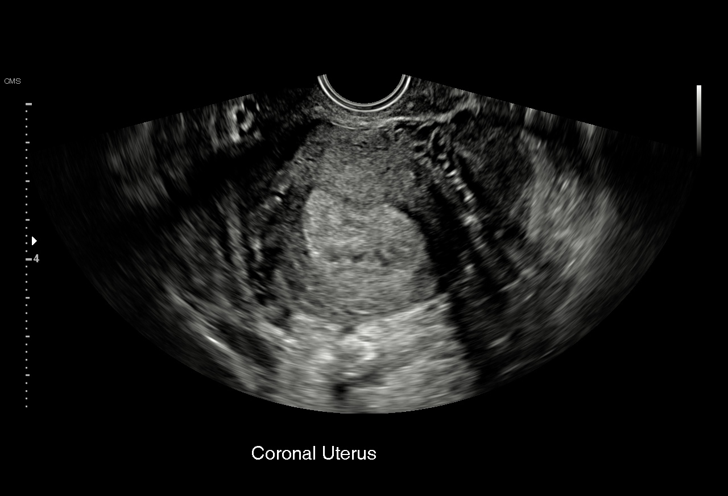
[im 11/28]
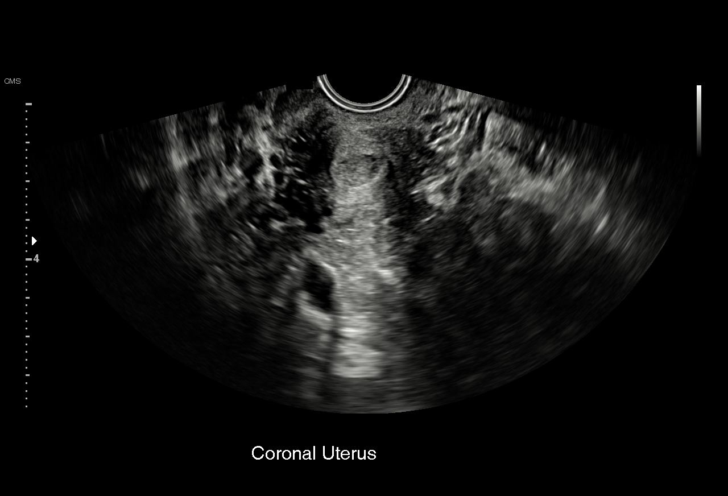
[im 13/28]
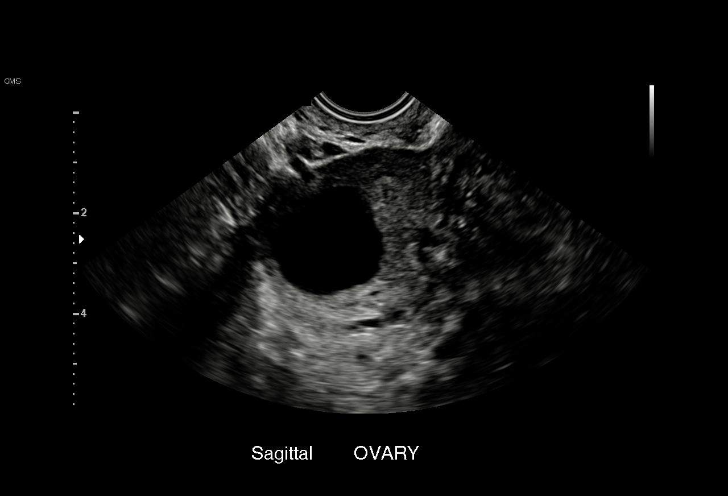
[im 15/28]
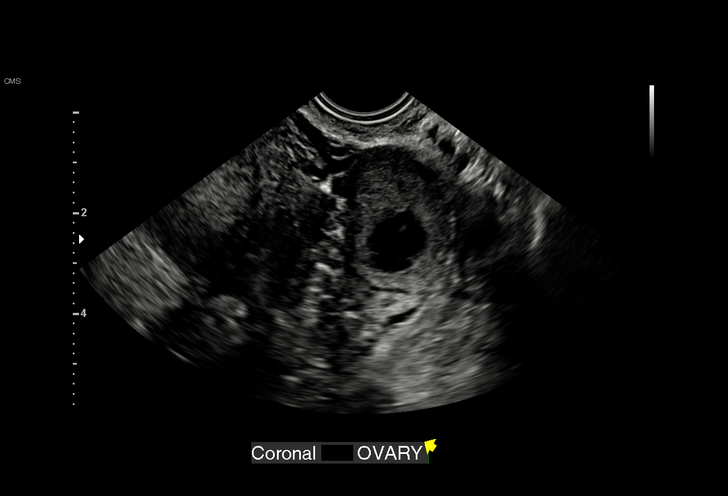
[im 16/28]
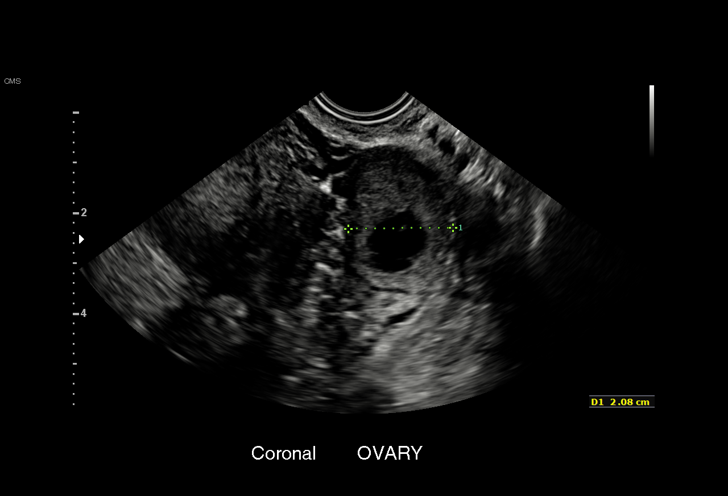
[im 18/28]
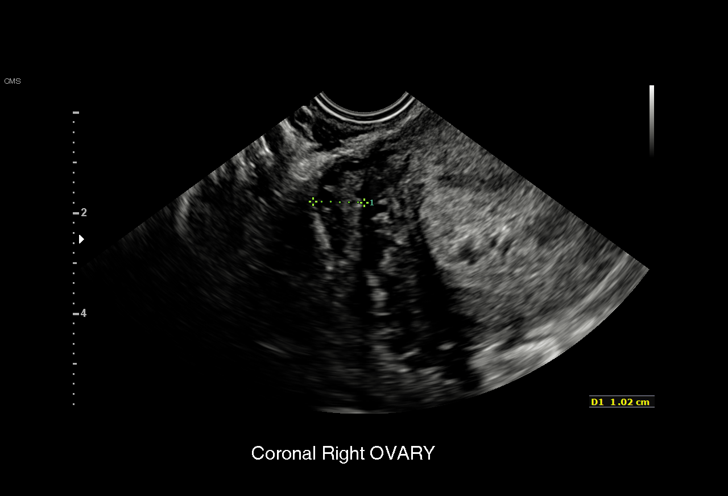
[im 20/28]
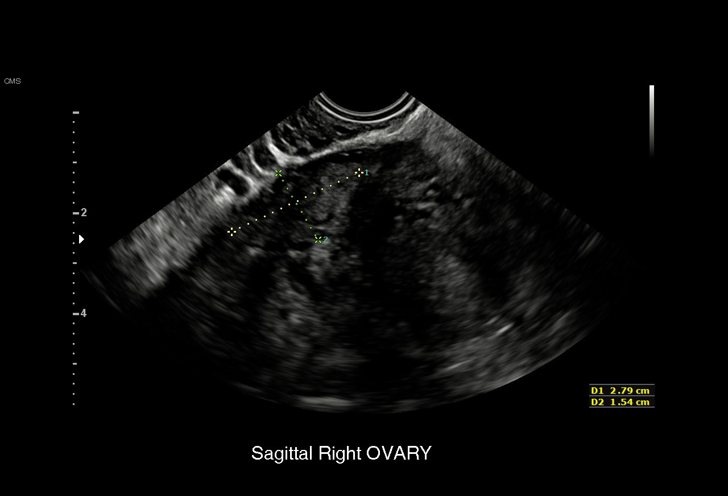
[im 22/28]
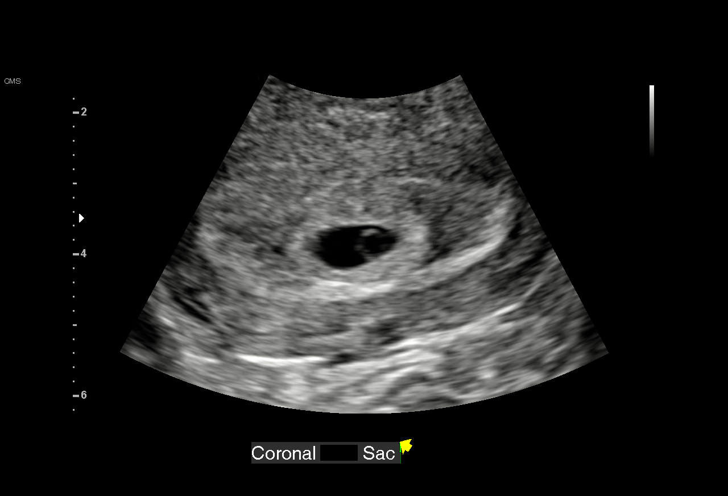
[im 24/28]
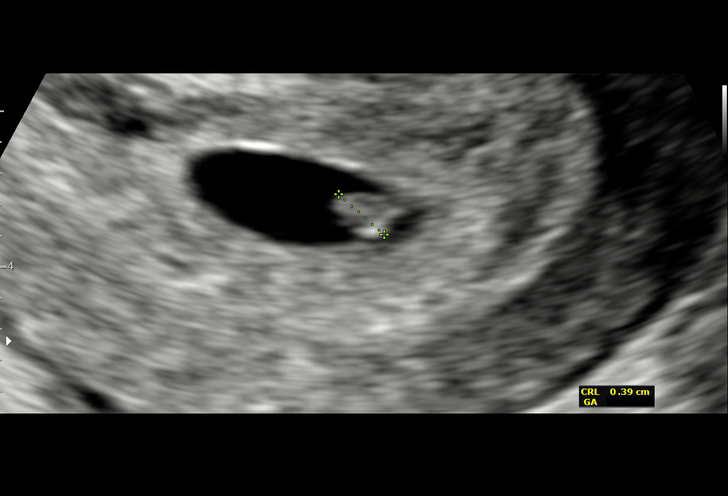
[im 26/28]
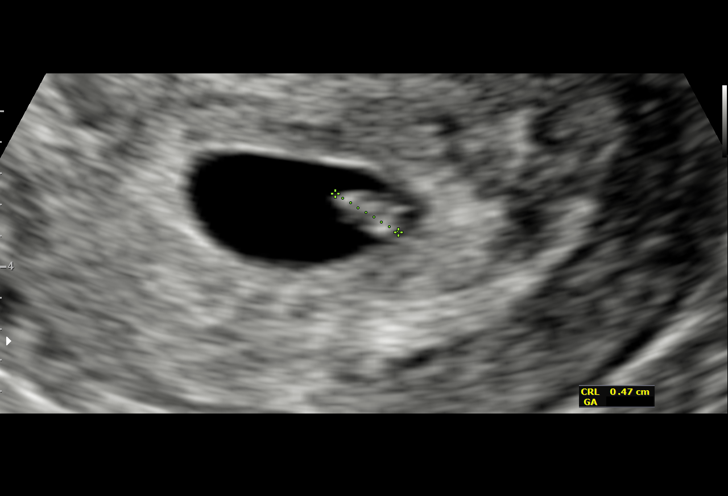
[im 28/28]
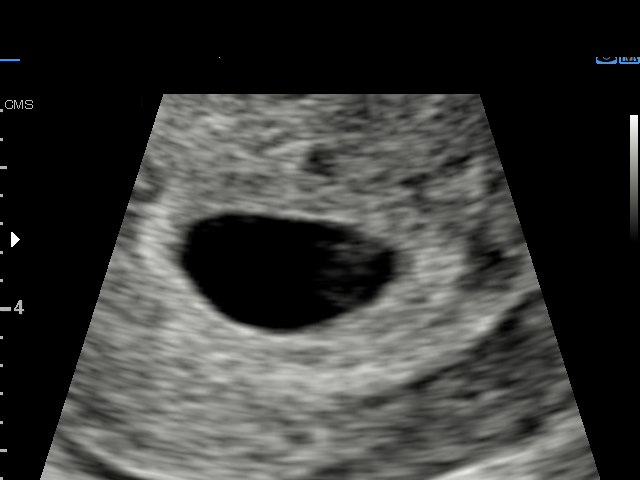

[15 of 28 positions shown; findings below may reference images not displayed]

FINDINGS: Intrauterine gestational sac: Visualized

Yolk sac:  Visualized

Embryo:  Visualized

Cardiac Activity: Visualized

Heart Rate: 117 bpm

CRL:   4  mm   6 w 1 d                  US EDC: August 17, 2017

Subchorionic hemorrhage:  None visualized.

Maternal uterus/adnexae: Cervical os is closed. Maternal ovaries are
normal in size and contour. Corpus luteum noted on the left
measuring 2.3 x 2.2 cm.
IMPRESSION: Single live intrauterine gestation with estimated gestational age of
approximately 6 weeks. No subchorionic hemorrhage evident.

## 2019-01-06 ENCOUNTER — Emergency Department (HOSPITAL_COMMUNITY)
Admission: EM | Admit: 2019-01-06 | Discharge: 2019-01-06 | Disposition: A | Discharge status: Home / Self Care | Payer: BC Managed Care – PPO | Attending: Emergency Medicine | Admitting: Emergency Medicine

## 2019-01-06 ENCOUNTER — Encounter (HOSPITAL_COMMUNITY): Payer: Self-pay | Admitting: *Deleted

## 2019-01-06 ENCOUNTER — Other Ambulatory Visit: Payer: Self-pay

## 2019-01-06 DIAGNOSIS — N760 Acute vaginitis: Secondary | ICD-10-CM | POA: Insufficient documentation

## 2019-01-06 DIAGNOSIS — R102 Pelvic and perineal pain: Secondary | ICD-10-CM | POA: Diagnosis present

## 2019-01-06 DIAGNOSIS — B9689 Other specified bacterial agents as the cause of diseases classified elsewhere: Secondary | ICD-10-CM

## 2019-01-06 DIAGNOSIS — R1030 Lower abdominal pain, unspecified: Secondary | ICD-10-CM

## 2019-01-06 DIAGNOSIS — Z79899 Other long term (current) drug therapy: Secondary | ICD-10-CM | POA: Diagnosis not present

## 2019-01-06 LAB — COMPREHENSIVE METABOLIC PANEL
ALT: 18 U/L (ref 0–44)
AST: 20 U/L (ref 15–41)
Albumin: 3.9 g/dL (ref 3.5–5.0)
Alkaline Phosphatase: 52 U/L (ref 38–126)
Anion gap: 6 (ref 5–15)
BUN: 10 mg/dL (ref 6–20)
CO2: 28 mmol/L (ref 22–32)
Calcium: 9.3 mg/dL (ref 8.9–10.3)
Chloride: 105 mmol/L (ref 98–111)
Creatinine, Ser: 0.93 mg/dL (ref 0.44–1.00)
GFR calc Af Amer: >60 mL/min (ref >60)
GFR calc non Af Amer: >60 mL/min (ref >60)
Glucose, Bld: 94 mg/dL (ref 70–99)
Potassium: 4.1 mmol/L (ref 3.5–5.1)
Sodium: 139 mmol/L (ref 135–145)
Total Bilirubin: 0.6 mg/dL (ref 0.3–1.2)
Total Protein: 7.6 g/dL (ref 6.5–8.1)

## 2019-01-06 LAB — CBC WITH DIFFERENTIAL/PLATELET
Abs Immature Granulocytes: 0.02 10*3/uL (ref 0.00–0.07)
Basophils Absolute: 0 10*3/uL (ref 0.0–0.1)
Basophils Relative: 0 %
Eosinophils Absolute: 0.1 10*3/uL (ref 0.0–0.5)
Eosinophils Relative: 2 %
HCT: 39.4 % (ref 36.0–46.0)
Hemoglobin: 12.1 g/dL (ref 12.0–15.0)
Immature Granulocytes: 0 %
Lymphocytes Relative: 43 %
Lymphs Abs: 2.5 10*3/uL (ref 0.7–4.0)
MCH: 27.9 pg (ref 26.0–34.0)
MCHC: 30.7 g/dL (ref 30.0–36.0)
MCV: 91 fL (ref 80.0–100.0)
Monocytes Absolute: 0.4 10*3/uL (ref 0.1–1.0)
Monocytes Relative: 7 %
Neutro Abs: 2.8 10*3/uL (ref 1.7–7.7)
Neutrophils Relative %: 48 %
Platelets: 282 10*3/uL (ref 150–400)
RBC: 4.33 MIL/uL (ref 3.87–5.11)
RDW: 14.6 % (ref 11.5–15.5)
WBC: 5.8 10*3/uL (ref 4.0–10.5)
nRBC: 0 % (ref 0.0–0.2)

## 2019-01-06 LAB — WET PREP, GENITAL
Sperm: NONE SEEN
Trich, Wet Prep: NONE SEEN
Yeast Wet Prep HPF POC: NONE SEEN

## 2019-01-06 LAB — URINALYSIS, ROUTINE W REFLEX MICROSCOPIC
Bilirubin Urine: NEGATIVE
Glucose, UA: NEGATIVE mg/dL
Hgb urine dipstick: NEGATIVE
Ketones, ur: NEGATIVE mg/dL
Leukocytes,Ua: NEGATIVE
Nitrite: NEGATIVE
Protein, ur: NEGATIVE mg/dL
Specific Gravity, Urine: 1.021 (ref 1.005–1.030)
pH: 6 (ref 5.0–8.0)

## 2019-01-06 LAB — I-STAT BETA HCG BLOOD, ED (MC, WL, AP ONLY): I-stat hCG, quantitative: <5 m[IU]/mL (ref <5)

## 2019-01-06 MED ORDER — METRONIDAZOLE 500 MG PO TABS: 500.0000 mg | ORAL_TABLET | Freq: Two times a day (BID) | ORAL | 0 refills | Status: AC

## 2019-01-06 MED ORDER — CEFTRIAXONE SODIUM 250 MG IJ SOLR
250.0000 mg | Freq: Once | INTRAMUSCULAR | Status: AC
  Administered 2019-01-06: 250 mg via INTRAMUSCULAR
  Filled 2019-01-06: qty 250

## 2019-01-06 MED ORDER — STERILE WATER FOR INJECTION IJ SOLN
INTRAMUSCULAR | Status: AC
  Administered 2019-01-06: 2 mL
  Filled 2019-01-06: qty 10

## 2019-01-06 MED ORDER — AZITHROMYCIN 250 MG PO TABS
1000.0000 mg | ORAL_TABLET | Freq: Once | ORAL | Status: AC
  Administered 2019-01-06: 15:00:00 1000 mg via ORAL
  Filled 2019-01-06: qty 4

## 2019-01-06 NOTE — ED Provider Notes (Signed)
[facility]-EMERGENCY DEPT Provider Note   CSN: [id number] Arrival date & time: 01/06/19  1209    History   Chief Complaint Chief Complaint  Patient presents with  . Abdominal Pain  . Vaginal Bleeding    HPI Bailey Hicks is a 28 y.o. female with history of ovarian cyst who presents with intermittent lower abdominal pain for the past 2 days.  Patient reports the pain is mostly on the left, however she has felt it on the right couple times.  She describes it as a dull ache.  She has had some vaginal spotting.  She denies any other abnormal vaginal discharge.  She had unprotected sex 5 days ago and does have a concern for STD exposure.  She denies any fever, chest pain, shortness of breath, nausea, vomiting, cough.  LMP 12/19/2018 which was normal.  Patient has taken Tylenol at home for symptoms with some relief.     HPI  Past Medical History:  Diagnosis Date  . Anemia   . Chlamydia 2013  . Gonorrhea 2013  . Infection    UTI  . Ovarian cyst     Patient Active Problem List   Diagnosis Date Noted  . Obesity affecting pregnancy, antepartum 02/05/2017  . Hx of incompetent cervix, currently pregnant 02/03/2017  . GERD without esophagitis 03/15/2014    Past Surgical History:  Procedure Laterality Date  . ANTERIOR CRUCIATE LIGAMENT REPAIR Right   . WISDOM TOOTH EXTRACTION       OB History    Gravida  2   Para  1   Term      Preterm      AB  0   Living  0     SAB  0   TAB      Ectopic      Multiple  0   Live Births  0            Home Medications    Prior to Admission medications   Medication Sig Start Date End Date Taking? Authorizing Provider  acetaminophen (TYLENOL) 325 MG tablet Take 650 mg by mouth every 6 (six) hours as needed for mild pain or headache.   Yes [provider]  metroNIDAZOLE (FLAGYL) 500 MG tablet Take 1 tablet (500 mg total) by mouth 2 (two) times daily. 01/06/19   Diamonique Ruedas, Waylan BogaAlexandra M, PA-C   norgestimate-ethinyl estradiol (ORTHO-CYCLEN,SPRINTEC,PREVIFEM) 0.25-35 MG-MCG tablet Take 1 tablet by mouth daily. Patient not taking: Reported on 11/07/2017 03/03/17   Pincus LargePhelps, Jazma Y, DO    Family History Family History  Adopted: Yes  Problem Relation Age of Onset  . Thyroid disease Mother   . Hypertension Other     Social History Social History   Tobacco Use  . Smoking status: Never Smoker  . Smokeless tobacco: Never Used  Substance Use Topics  . Alcohol use: Yes    Alcohol/week: 0.0 standard drinks    Comment: occ  . Drug use: No     Allergies   Patient has no known allergies.   Review of Systems Review of Systems  Constitutional: Negative for chills and fever.  HENT: Negative for facial swelling and sore throat.   Respiratory: Negative for shortness of breath.   Cardiovascular: Negative for chest pain.  Gastrointestinal: Positive for abdominal pain. Negative for blood in stool, diarrhea, nausea and vomiting.  Genitourinary: Positive for pelvic pain, vaginal bleeding ( "spotting") and vaginal discharge. Negative for dysuria.  Musculoskeletal: Negative for back pain.  Skin: Negative  for rash and wound.  Neurological: Negative for headaches.  Psychiatric/Behavioral: The patient is not nervous/anxious.      Physical Exam Updated Vital Signs BP 136/80 (BP Location: Right Arm)   Pulse 85   Temp 98.1 F (36.7 C) (Oral)   Resp 18   Ht 5\' 7"  (1.702 m)   Wt 108 kg   LMP 12/19/2018   SpO2 99%   BMI 37.29 kg/m   Physical Exam Vitals signs and nursing note reviewed. Exam conducted with a chaperone present.  Constitutional:      General: She is not in acute distress.    Appearance: She is well-developed. She is obese. She is not diaphoretic.  HENT:     Head: Normocephalic and atraumatic.     Mouth/Throat:     Pharynx: No oropharyngeal exudate.  Eyes:     General: No scleral icterus.       Right eye: No discharge.        Left eye: No discharge.      Conjunctiva/sclera: Conjunctivae normal.     Pupils: Pupils are equal, round, and reactive to light.  Neck:     Musculoskeletal: Normal range of motion and neck supple.     Thyroid: No thyromegaly.  Cardiovascular:     Rate and Rhythm: Normal rate and regular rhythm.     Heart sounds: Normal heart sounds. No murmur. No friction rub. No gallop.   Pulmonary:     Effort: Pulmonary effort is normal. No respiratory distress.     Breath sounds: Normal breath sounds. No stridor. No wheezing or rales.  Abdominal:     General: Bowel sounds are normal. There is no distension.     Palpations: Abdomen is soft.     Tenderness: There is no abdominal tenderness. There is no right CVA tenderness, left CVA tenderness, guarding or rebound.  Genitourinary:    Vagina: No vaginal discharge or tenderness.     Cervix: No cervical motion tenderness.     Uterus: Not tender.      Adnexa:        Right: No tenderness.         Left: No tenderness.    Lymphadenopathy:     Cervical: No cervical adenopathy.  Skin:    General: Skin is warm and dry.     Coloration: Skin is not pale.     Findings: No rash.  Neurological:     Mental Status: She is alert.     Coordination: Coordination normal.      ED Treatments / Results  Labs (all labs ordered are listed, but only abnormal results are displayed) Labs Reviewed  WET PREP, GENITAL - Abnormal; Notable for the following components:      Result Value   Clue Cells Wet Prep HPF POC PRESENT (*)    WBC, Wet Prep HPF POC FEW (*)    All other components within normal limits  URINALYSIS, ROUTINE W REFLEX MICROSCOPIC - Abnormal; Notable for the following components:   APPearance HAZY (*)    All other components within normal limits  CBC WITH DIFFERENTIAL/PLATELET  COMPREHENSIVE METABOLIC PANEL  HIV ANTIBODY (ROUTINE TESTING W REFLEX)  RPR  I-STAT BETA HCG BLOOD, ED ([facility], [facility], [facility] ONLY)  GC/CHLAMYDIA PROBE AMP ([place]) NOT AT [facility]    EKG None  Radiology  No results found.  Procedures Procedures (including critical care time)  Medications Ordered in ED Medications  cefTRIAXone (ROCEPHIN) injection 250 mg (250 mg Intramuscular Given 01/06/19 1521)  azithromycin (ZITHROMAX) tablet 1,000 mg (1,000 mg Oral Given 01/06/19 1521)  sterile water (preservative free) injection (2 mLs  Given 01/06/19 1522)     Initial Impression / Assessment and Plan / ED Course  I have reviewed the triage vital signs and the nursing notes.  Pertinent labs & imaging results that were available during my care of the patient were reviewed by me and considered in my medical decision making (see chart for details).        Patient presenting with intermittent left lower abdominal pain that she describes as slight and dull ache.  CBC, CMP unremarkable.  Wet prep shows clue cells.  HIV, RPR, GC/chlamydia sent and pending.  UA is negative.  No pain on pelvic exam.  No abdominal tenderness.  No indication for imaging.  Will treat BV with Flagyl and patient opted for prophylactic treatment with Rocephin and azithromycin.  Return precautions discussed.  Patient understands and agrees with plan.  Patient vitals stable throughout ED course and discharged in satisfactory condition.  Final Clinical Impressions(s) / ED Diagnoses   Final diagnoses:  Lower abdominal pain  BV (bacterial vaginosis)    ED Discharge Orders         Ordered    metroNIDAZOLE (FLAGYL) 500 MG tablet  2 times daily     01/06/19 1533           Ruffus Kamaka, McFarlandAlexandra M, PA-C 01/06/19 1611    Mancel BaleWentz, [place], MD 01/06/19 [phone]

## 2019-01-06 NOTE — ED Triage Notes (Signed)
Pt complains of lower abdominal pain and spotting x 2 days. Pt is concerned she may have an STD. Pt last had unprotected sex 5 days ago. Pt denies dysuria, n/v/d.

## 2019-01-06 NOTE — Discharge Instructions (Addendum)
Take Flagyl for your bacterial vaginosis until completed. Do not take this medication with alcohol. You have been treated for gonorrhea and chlamydia today. You will be called in 3 days if any of your tests return positive. In that case, please make all of your sexual partners aware that they will need to be treated as well. Abstain from intercourse for one week until you have both been treated. Use condoms in the future to help prevent sexually transmitted disease and unwanted pregnancy. You can go to the health department in the future for free STD testing.

## 2019-01-07 LAB — HIV ANTIBODY (ROUTINE TESTING W REFLEX): HIV Screen 4th Generation wRfx: NONREACTIVE

## 2019-01-07 LAB — RPR: RPR Ser Ql: NONREACTIVE

## 2019-01-10 LAB — GC/CHLAMYDIA PROBE AMP (~~LOC~~) NOT AT ARMC
Chlamydia: NEGATIVE
Neisseria Gonorrhea: NEGATIVE

## 2020-01-24 ENCOUNTER — Other Ambulatory Visit: Payer: Self-pay

## 2020-01-24 ENCOUNTER — Emergency Department (HOSPITAL_COMMUNITY): Payer: Self-pay

## 2020-01-24 ENCOUNTER — Emergency Department (HOSPITAL_COMMUNITY)
Admission: EM | Admit: 2020-01-24 | Discharge: 2020-01-24 | Disposition: A | Discharge status: Home / Self Care | Payer: Self-pay | Attending: Emergency Medicine | Admitting: Emergency Medicine

## 2020-01-24 ENCOUNTER — Encounter (HOSPITAL_COMMUNITY): Payer: Self-pay | Admitting: Emergency Medicine

## 2020-01-24 DIAGNOSIS — R102 Pelvic and perineal pain: Secondary | ICD-10-CM

## 2020-01-24 DIAGNOSIS — Z7982 Long term (current) use of aspirin: Secondary | ICD-10-CM | POA: Insufficient documentation

## 2020-01-24 DIAGNOSIS — K219 Gastro-esophageal reflux disease without esophagitis: Secondary | ICD-10-CM | POA: Insufficient documentation

## 2020-01-24 DIAGNOSIS — N83209 Unspecified ovarian cyst, unspecified side: Secondary | ICD-10-CM | POA: Insufficient documentation

## 2020-01-24 LAB — URINALYSIS, ROUTINE W REFLEX MICROSCOPIC
Bilirubin Urine: NEGATIVE
Glucose, UA: NEGATIVE mg/dL
Ketones, ur: NEGATIVE mg/dL
Leukocytes,Ua: NEGATIVE
Nitrite: NEGATIVE
Protein, ur: NEGATIVE mg/dL
Specific Gravity, Urine: 1.028 (ref 1.005–1.030)
pH: 5 (ref 5.0–8.0)

## 2020-01-24 LAB — COMPREHENSIVE METABOLIC PANEL
ALT: 14 U/L (ref 0–44)
AST: 17 U/L (ref 15–41)
Albumin: 4.2 g/dL (ref 3.5–5.0)
Alkaline Phosphatase: 57 U/L (ref 38–126)
Anion gap: 11 (ref 5–15)
BUN: 15 mg/dL (ref 6–20)
CO2: 25 mmol/L (ref 22–32)
Calcium: 9.4 mg/dL (ref 8.9–10.3)
Chloride: 103 mmol/L (ref 98–111)
Creatinine, Ser: 0.84 mg/dL (ref 0.44–1.00)
GFR calc Af Amer: >60 mL/min (ref >60)
GFR calc non Af Amer: >60 mL/min (ref >60)
Glucose, Bld: 106 mg/dL — ABNORMAL HIGH (ref 70–99)
Potassium: 4.1 mmol/L (ref 3.5–5.1)
Sodium: 139 mmol/L (ref 135–145)
Total Bilirubin: 0.7 mg/dL (ref 0.3–1.2)
Total Protein: 8.1 g/dL (ref 6.5–8.1)

## 2020-01-24 LAB — WET PREP, GENITAL
Sperm: NONE SEEN
Trich, Wet Prep: NONE SEEN
Yeast Wet Prep HPF POC: NONE SEEN

## 2020-01-24 LAB — CBC
HCT: 36.9 % (ref 36.0–46.0)
Hemoglobin: 11.8 g/dL — ABNORMAL LOW (ref 12.0–15.0)
MCH: 28.8 pg (ref 26.0–34.0)
MCHC: 32 g/dL (ref 30.0–36.0)
MCV: 90 fL (ref 80.0–100.0)
Platelets: 254 10*3/uL (ref 150–400)
RBC: 4.1 MIL/uL (ref 3.87–5.11)
RDW: 14.4 % (ref 11.5–15.5)
WBC: 6.7 10*3/uL (ref 4.0–10.5)
nRBC: 0 % (ref 0.0–0.2)

## 2020-01-24 LAB — I-STAT BETA HCG BLOOD, ED (MC, WL, AP ONLY): I-stat hCG, quantitative: <5 m[IU]/mL (ref <5)

## 2020-01-24 LAB — LIPASE, BLOOD: Lipase: 26 U/L (ref 11–51)

## 2020-01-24 MED ORDER — IBUPROFEN 800 MG PO TABS
800.0000 mg | ORAL_TABLET | Freq: Once | ORAL | Status: AC
  Administered 2020-01-24: 800 mg via ORAL
  Filled 2020-01-24: qty 1

## 2020-01-24 NOTE — ED Notes (Signed)
US at bedside

## 2020-01-24 NOTE — ED Notes (Signed)
Pt verbalizes understanding of DC instructions. Pt belongings returned and is ambulatory out of ED.  

## 2020-01-24 NOTE — ED Triage Notes (Signed)
Pt c/o lower and right side abd pains and vaginal bleeding with bright red blood since yesterday. Denies problems with bowels or urination  LMP 12/27/2019. Doesn't think it is her cycle causing the vaginal bleeding.

## 2020-01-24 NOTE — ED Provider Notes (Signed)
[facility]-EMERGENCY DEPT Provider Note   CSN: [id number] Arrival date & time: 01/24/20  1142     History Chief Complaint  Patient presents with  . Abdominal Pain    Bailey Fasterlicia Starrett is a 29 y.o. female with PMH of ovarian cyst and STIs presents the ED with acute onset RLQ/pelvic pain that began in the middle of the night.  Patient reports that she had been having some mild right-sided pelvic "fullness" and then had sexual intercourse with her boyfriend last evening.  She denies any associated dyspareunia.  Short thereafter, she felt something "pop" and she then developed significant pain symptoms with mild associated vaginal bleeding.  She does not believe that she is due for her menses and feels as though this is something separate.  She states that she has a history of ovarian cyst, however this felt worse.  She also endorses some mild associated nausea, but none on my examination.  She also feels as though her pain symptoms of improve dramatically since initial onset.  She denies any recent illness or infection, fevers or chills, current nausea, emesis, chest pain or difficulty breathing, changes in bowel habits, urinary symptoms, vaginal discharge, or other symptoms.  [facility] however [facility].  HPI     Past Medical History:  Diagnosis Date  . Anemia   . Chlamydia 2013  . Gonorrhea 2013  . Infection    UTI  . Ovarian cyst     Patient Active Problem List   Diagnosis Date Noted  . Obesity affecting pregnancy, antepartum 02/05/2017  . Hx of incompetent cervix, currently pregnant 02/03/2017  . GERD without esophagitis 03/15/2014    Past Surgical History:  Procedure Laterality Date  . ANTERIOR CRUCIATE LIGAMENT REPAIR Right   . WISDOM TOOTH EXTRACTION       OB History    Gravida  2   Para  1   Term      Preterm      AB  0   Living  0     SAB  0   TAB      Ectopic      Multiple  0   Live Births  0             Family History  Adopted: Yes  Problem Relation Age of Onset  . Thyroid disease Mother   . Hypertension Other     Social History   Tobacco Use  . Smoking status: Never Smoker  . Smokeless tobacco: Never Used  Substance Use Topics  . Alcohol use: Yes    Alcohol/week: 0.0 standard drinks    Comment: occ  . Drug use: No    Home Medications Prior to Admission medications   Medication Sig Start Date End Date Taking? Authorizing Provider  Aspirin-Salicylamide-Caffeine (BC HEADACHE PO) Take 1 packet by mouth daily as needed (pain).   Yes [provider]  ibuprofen (ADVIL) 200 MG tablet Take 200 mg by mouth every 6 (six) hours as needed for moderate pain.   Yes [provider]  metroNIDAZOLE (FLAGYL) 500 MG tablet Take 1 tablet (500 mg total) by mouth 2 (two) times daily. Patient not taking: Reported on 01/24/2020 01/06/19   Emi HolesLaw, Alexandra M, PA-C  norgestimate-ethinyl estradiol (ORTHO-CYCLEN,SPRINTEC,PREVIFEM) 0.25-35 MG-MCG tablet Take 1 tablet by mouth daily. Patient not taking: Reported on 11/07/2017 03/03/17   Pincus LargePhelps, Jazma Y, DO    Allergies    Patient has no known allergies.  Review of Systems   Review of Systems  All other systems reviewed and are negative.   Physical Exam Updated Vital Signs BP (!) 159/85   Pulse 77   Temp 98.5 F (36.9 C) (Oral)   Resp 18   LMP 12/27/2019   SpO2 99%   Physical Exam Vitals and nursing note reviewed. Exam conducted with a chaperone present.  Constitutional:      General: She is not in acute distress.    Appearance: Normal appearance. She is not ill-appearing.  HENT:     Head: Normocephalic and atraumatic.  Eyes:     General: No scleral icterus.    Conjunctiva/sclera: Conjunctivae normal.  Cardiovascular:     Rate and Rhythm: Normal rate and regular rhythm.     Pulses: Normal pulses.     Heart sounds: Normal heart sounds.  Pulmonary:     Effort: Pulmonary effort is normal. No respiratory  distress.     Breath sounds: Normal breath sounds. No wheezing.  Abdominal:     Comments: Soft, nondistended.  TTP in right inguinal/pelvic region.  No TTP over McBurney's point in RLQ.  Negative Rovsing sign and Psoas sign.  No TTP elsewhere.  No overlying skin changes.  Genitourinary:    Comments: External genitalia: Normal external genitalia. Speculum exam: Bleeding noted in the vaginal vault.  Appears to be coming from cervical os.  No obvious traumatic wall lacerations.  Cervix is nonerythematous.  No abnormal discharge noted. Bimanual exam: No significant CMT or adnexal tenderness.  No masses appreciated. Musculoskeletal:     Right lower leg: No edema.     Left lower leg: No edema.  Skin:    General: Skin is dry.     Capillary Refill: Capillary refill takes less than 2 seconds.  Neurological:     Mental Status: She is alert and oriented to person, place, and time.     GCS: GCS eye subscore is 4. GCS verbal subscore is 5. GCS motor subscore is 6.  Psychiatric:        Mood and Affect: Mood normal.        Behavior: Behavior normal.        Thought Content: Thought content normal.     ED Results / Procedures / Treatments   Labs (all labs ordered are listed, but only abnormal results are displayed) Labs Reviewed  WET PREP, GENITAL - Abnormal; Notable for the following components:      Result Value   Clue Cells Wet Prep HPF POC PRESENT (*)    WBC, Wet Prep HPF POC PRESENT (*)    All other components within normal limits  COMPREHENSIVE METABOLIC PANEL - Abnormal; Notable for the following components:   Glucose, Bld 106 (*)    All other components within normal limits  CBC - Abnormal; Notable for the following components:   Hemoglobin 11.8 (*)    All other components within normal limits  URINALYSIS, ROUTINE W REFLEX MICROSCOPIC - Abnormal; Notable for the following components:   Hgb urine dipstick LARGE (*)    Bacteria, UA RARE (*)    Crystals PRESENT (*)    All other  components within normal limits  LIPASE, BLOOD  I-STAT BETA HCG BLOOD, ED ([facility], [facility], [facility] ONLY)  GC/CHLAMYDIA PROBE AMP ([place]) NOT AT [facility]    EKG None  Radiology US PELVIC COMPLETE W TRANSVAGINAL AND TORSION R/O  Result Date: 01/24/2020 CLINICAL DATA:  Initial evaluation for acute right lower quadrant pain for 1 week. EXAM:  TRANSABDOMINAL AND TRANSVAGINAL ULTRASOUND OF PELVIS DOPPLER ULTRASOUND OF OVARIES TECHNIQUE: Both transabdominal and transvaginal ultrasound examinations of the pelvis were performed. Transabdominal technique was performed for global imaging of the pelvis including uterus, ovaries, adnexal regions, and pelvic cul-de-sac. It was necessary to proceed with endovaginal exam following the transabdominal exam to visualize the uterus, endometrium, and ovaries. Color and duplex Doppler ultrasound was utilized to evaluate blood flow to the ovaries. COMPARISON:  Prior ultrasound from 11/19/2014. FINDINGS: Uterus Measurements: 8.7 x 4.7 x 5.4 cm = volume: 116 mL. No fibroids or other mass visualized. Endometrium Thickness: 17 mm.  No focal abnormality visualized. Right ovary Measurements: 5.3 x 3.5 x 3.5 cm = volume: 33.8 mL. 4.7 x 3.3 x 3.1 cm complex hypoechoic cyst seen extending from the right ovary, likely an exophytic hemorrhagic cyst. Increased vascularity surrounding this lesion favored to lie within compressed ovarian parenchyma. No definite internal solid component. Left ovary Measurements: 2.5 x 1.8 x 1.6 cm = volume: 3.6 mL. Normal appearance/no adnexal mass. Pulsed Doppler evaluation of both ovaries demonstrates normal low-resistance arterial and venous waveforms. Other findings Moderate volume complex free fluid within the pelvis, suspected to reflect blood products, and suggesting prior cyst rupture. IMPRESSION: 1. 4.7 cm complex right adnexal cyst, likely a hemorrhagic cyst. Associated moderate volume complex free fluid within the pelvis suggestive of recent cyst rupture.  Short interval follow-up ultrasound in 8-12 weeks recommended to ensure resolution. 2. No evidence for ovarian torsion. 3. Otherwise normal pelvic ultrasound for age. Electronically Signed   By: Rise Mu M.D.   On: 01/24/2020 19:29    Procedures Procedures (including critical care time)  Medications Ordered in ED Medications  ibuprofen (ADVIL) tablet 800 mg (800 mg Oral Given 01/24/20 2127)    ED Course  I have reviewed the triage vital signs and the nursing notes.  Pertinent labs & imaging results that were available during my care of the patient were reviewed by me and considered in my medical decision making (see chart for details).    MDM Rules/Calculators/A&P                          Laboratory work-up is reviewed.  Notable for hematuria (large), negative i-STAT beta-hCG, no leukocytosis, and only mild anemia to 11.8 hemoglobin, relatively consistent with baseline.  US pelvic complete with transvaginal and torsion rule out is obtained.  I personally reviewed findings demonstrates a 4.7 cm complex right adnexal cyst, likely hemorrhagic.  There is also an associated moderate volume complex free fluid within the pelvis suggestive of recent cyst rupture.  Radiologist is recommending short interval follow-up ultrasound in 8 to 12 weeks to ensure resolution.  Given these findings on ultrasound, consistent with patient's history, I do not feel as though CT abdomen is warranted.  She did not have any abdominal tenderness and her focal TTP is predominantly in inguinal/pelvic region.    Patient's pelvic exam was reassuring.  Do not feel as though empiric treatment with GC is warranted given that she lacks any obvious exposures and is in a committed relationship with her boyfriend.  She also denies any discharge and only want to get tested because she was here for pelvic complaints anyway.  Patient was instructed to follow-up with her OB/GYN regarding today's encounter.  Strict ED  return precautions discussed.  All of the evaluation and work-up results were discussed with the patient and any family at bedside.  Patient and/or family were informed that  while patient is appropriate for discharge at t a repeat his time, some medical emergencies may only develop or become detectable after a period of time.  I specifically instructed patient and/or family to return to return to the ED or seek immediate medical attention for any new or worsening symptoms.  They were provided opportunity to ask any additional questions and have none at this time.  Prior to discharge patient is feeling well, agreeable with plan for discharge home.  They have expressed understanding of verbal discharge instructions as well as return precautions and are agreeable to the plan.    Final Clinical Impression(s) / ED Diagnoses Final diagnoses:  Ruptured ovarian cyst    Rx / DC Orders ED Discharge Orders    None       Lorelee New, PA-C 01/24/20 2202    Terald Sleeper, MD 01/25/20 0100

## 2020-01-24 NOTE — Discharge Instructions (Addendum)
Please follow-up with your OB/GYN regarding today's encounter.  If you no longer, please get established soon as possible.  You will need to have repeat pelvic ultrasound in 8 to 12 weeks to ensure resolution.    Your gonorrhea and Chlamydia testing is pending.  Please check your MyChart.  If positive, you will need to get treated with antibiotics.  However, given your history and findings here during your comprehensive work-up, suspect that your pain symptoms are entirely attributed to your visualized hemorrhagic cyst and recent cyst rupture.  I am glad that your pain symptoms have improved.  Please continue take ibuprofen as needed for pain symptoms.  Return to the ED or seek immediate medical attention should you experience any new or worsening symptoms.

## 2020-01-24 NOTE — ED Notes (Addendum)
Provided pt with sanitary napkin. Pt denies further needs at this time.

## 2020-01-25 LAB — GC/CHLAMYDIA PROBE AMP (~~LOC~~) NOT AT ARMC
Chlamydia: NEGATIVE
Comment: NEGATIVE
Comment: NORMAL
Neisseria Gonorrhea: NEGATIVE
